# Patient Record
Sex: Female | Born: 1985 | Race: Black or African American | Hispanic: No | Smoking: Never smoker
Health system: Southern US, Community
[De-identification: ages and names within clinical notes are randomized; demographics above are authoritative.]

## PROBLEM LIST (undated history)

## (undated) DIAGNOSIS — N390 Urinary tract infection, site not specified: Secondary | ICD-10-CM

## (undated) DIAGNOSIS — E119 Type 2 diabetes mellitus without complications: Secondary | ICD-10-CM

## (undated) DIAGNOSIS — F329 Major depressive disorder, single episode, unspecified: Secondary | ICD-10-CM

## (undated) DIAGNOSIS — N879 Dysplasia of cervix uteri, unspecified: Secondary | ICD-10-CM

## (undated) DIAGNOSIS — F32A Depression, unspecified: Secondary | ICD-10-CM

## (undated) DIAGNOSIS — T8859XA Other complications of anesthesia, initial encounter: Secondary | ICD-10-CM

## (undated) DIAGNOSIS — G35 Multiple sclerosis: Secondary | ICD-10-CM

## (undated) DIAGNOSIS — G709 Myoneural disorder, unspecified: Secondary | ICD-10-CM

## (undated) DIAGNOSIS — E109 Type 1 diabetes mellitus without complications: Secondary | ICD-10-CM

## (undated) HISTORY — PX: CERVIX LESION DESTRUCTION: SHX591

## (undated) HISTORY — DX: Depression, unspecified: F32.A

## (undated) HISTORY — DX: Type 1 diabetes mellitus without complications: E10.9

## (undated) HISTORY — PX: EYE SURGERY: SHX253

## (undated) HISTORY — DX: Dysplasia of cervix uteri, unspecified: N87.9

## (undated) HISTORY — DX: Urinary tract infection, site not specified: N39.0

---
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
## 2011-06-06 HISTORY — PX: CERVIX LESION DESTRUCTION: SHX591

## 2016-11-24 DIAGNOSIS — G35 Multiple sclerosis: Secondary | ICD-10-CM | POA: Insufficient documentation

## 2016-11-24 DIAGNOSIS — E10319 Type 1 diabetes mellitus with unspecified diabetic retinopathy without macular edema: Secondary | ICD-10-CM | POA: Insufficient documentation

## 2016-11-24 DIAGNOSIS — F332 Major depressive disorder, recurrent severe without psychotic features: Secondary | ICD-10-CM | POA: Insufficient documentation

## 2016-11-24 DIAGNOSIS — D72829 Elevated white blood cell count, unspecified: Secondary | ICD-10-CM | POA: Insufficient documentation

## 2016-11-24 DIAGNOSIS — IMO0002 Reserved for concepts with insufficient information to code with codable children: Secondary | ICD-10-CM | POA: Insufficient documentation

## 2017-01-14 ENCOUNTER — Encounter (HOSPITAL_COMMUNITY): Payer: Self-pay | Admitting: *Deleted

## 2017-01-14 ENCOUNTER — Inpatient Hospital Stay (HOSPITAL_COMMUNITY)
Admission: EM | Admit: 2017-01-14 | Discharge: 2017-01-16 | DRG: 637 | Disposition: A | Discharge status: Home / Self Care | Payer: BLUE CROSS/BLUE SHIELD | Attending: Internal Medicine | Admitting: Internal Medicine

## 2017-01-14 DIAGNOSIS — T383X6A Underdosing of insulin and oral hypoglycemic [antidiabetic] drugs, initial encounter: Secondary | ICD-10-CM | POA: Diagnosis present

## 2017-01-14 DIAGNOSIS — N179 Acute kidney failure, unspecified: Secondary | ICD-10-CM | POA: Diagnosis present

## 2017-01-14 DIAGNOSIS — E875 Hyperkalemia: Secondary | ICD-10-CM | POA: Diagnosis present

## 2017-01-14 DIAGNOSIS — G35 Multiple sclerosis: Secondary | ICD-10-CM | POA: Diagnosis present

## 2017-01-14 DIAGNOSIS — E1010 Type 1 diabetes mellitus with ketoacidosis without coma: Secondary | ICD-10-CM | POA: Diagnosis not present

## 2017-01-14 DIAGNOSIS — Z91138 Patient's unintentional underdosing of medication regimen for other reason: Secondary | ICD-10-CM

## 2017-01-14 DIAGNOSIS — E876 Hypokalemia: Secondary | ICD-10-CM | POA: Diagnosis present

## 2017-01-14 DIAGNOSIS — Z882 Allergy status to sulfonamides status: Secondary | ICD-10-CM

## 2017-01-14 DIAGNOSIS — R6510 Systemic inflammatory response syndrome (SIRS) of non-infectious origin without acute organ dysfunction: Secondary | ICD-10-CM | POA: Diagnosis present

## 2017-01-14 DIAGNOSIS — E1110 Type 2 diabetes mellitus with ketoacidosis without coma: Secondary | ICD-10-CM | POA: Diagnosis present

## 2017-01-14 DIAGNOSIS — G9341 Metabolic encephalopathy: Secondary | ICD-10-CM | POA: Diagnosis present

## 2017-01-14 DIAGNOSIS — R000 Tachycardia, unspecified: Secondary | ICD-10-CM | POA: Diagnosis present

## 2017-01-14 DIAGNOSIS — Z9102 Food additives allergy status: Secondary | ICD-10-CM

## 2017-01-14 DIAGNOSIS — Z794 Long term (current) use of insulin: Secondary | ICD-10-CM

## 2017-01-14 HISTORY — DX: Type 2 diabetes mellitus without complications: E11.9

## 2017-01-14 HISTORY — DX: Multiple sclerosis: G35

## 2017-01-14 LAB — CBC
HCT: 45.8 % (ref 36.0–46.0)
Hemoglobin: 14.6 g/dL (ref 12.0–15.0)
MCH: 29.4 pg (ref 26.0–34.0)
MCHC: 31.9 g/dL (ref 30.0–36.0)
MCV: 92.3 fL (ref 78.0–100.0)
PLATELETS: 445 10*3/uL — AB (ref 150–400)
RBC: 4.96 MIL/uL (ref 3.87–5.11)
RDW: 13.1 % (ref 11.5–15.5)
WBC: 16.6 10*3/uL — AB (ref 4.0–10.5)

## 2017-01-14 LAB — I-STAT VENOUS BLOOD GAS, ED
ACID-BASE DEFICIT: 27 mmol/L — AB (ref 0.0–2.0)
Acid-base deficit: 24 mmol/L — ABNORMAL HIGH (ref 0.0–2.0)
Acid-base deficit: 21 mmol/L — ABNORMAL HIGH (ref 0.0–2.0)
BICARBONATE: 5.5 mmol/L — AB (ref 20.0–28.0)
Bicarbonate: 5.4 mmol/L — ABNORMAL LOW (ref 20.0–28.0)
Bicarbonate: 6.5 mmol/L — ABNORMAL LOW (ref 20.0–28.0)
O2 SAT: 75 %
O2 SAT: 55 %
O2 Saturation: 65 %
PCO2 VEN: 28.5 mmHg — AB (ref 44.0–60.0)
PCO2 VEN: 21.8 mmHg — AB (ref 44.0–60.0)
PCO2 VEN: 21.4 mmHg — AB (ref 44.0–60.0)
PH VEN: 6.897 — AB (ref 7.250–7.430)
PO2 VEN: 56 mmHg — AB (ref 32.0–45.0)
PO2 VEN: 58 mmHg — AB (ref 32.0–45.0)
PO2 VEN: 38 mmHg (ref 32.0–45.0)
TCO2: 6 mmol/L (ref 0–100)
TCO2: 6 mmol/L (ref 0–100)
TCO2: 7 mmol/L (ref 0–100)
pH, Ven: 7.006 — CL (ref 7.250–7.430)
pH, Ven: 7.094 — CL (ref 7.250–7.430)

## 2017-01-14 LAB — I-STAT CHEM 8, ED
BUN: 24 mg/dL — AB (ref 6–20)
BUN: 20 mg/dL (ref 6–20)
BUN: 22 mg/dL — AB (ref 6–20)
BUN: 19 mg/dL (ref 6–20)
CALCIUM ION: 1.15 mmol/L (ref 1.15–1.40)
CHLORIDE: 108 mmol/L (ref 101–111)
CHLORIDE: 112 mmol/L — AB (ref 101–111)
CREATININE: 1 mg/dL (ref 0.44–1.00)
Calcium, Ion: 1.17 mmol/L (ref 1.15–1.40)
Calcium, Ion: 1.01 mmol/L — ABNORMAL LOW (ref 1.15–1.40)
Calcium, Ion: 1.13 mmol/L — ABNORMAL LOW (ref 1.15–1.40)
Chloride: 113 mmol/L — ABNORMAL HIGH (ref 101–111)
Chloride: 113 mmol/L — ABNORMAL HIGH (ref 101–111)
Creatinine, Ser: 1 mg/dL (ref 0.44–1.00)
Creatinine, Ser: 0.8 mg/dL (ref 0.44–1.00)
Creatinine, Ser: 0.8 mg/dL (ref 0.44–1.00)
GLUCOSE: 318 mg/dL — AB (ref 65–99)
Glucose, Bld: 543 mg/dL (ref 65–99)
Glucose, Bld: 335 mg/dL — ABNORMAL HIGH (ref 65–99)
Glucose, Bld: 429 mg/dL — ABNORMAL HIGH (ref 65–99)
HCT: 44 % (ref 36.0–46.0)
HEMATOCRIT: 51 % — AB (ref 36.0–46.0)
HEMATOCRIT: 47 % — AB (ref 36.0–46.0)
HEMATOCRIT: 50 % — AB (ref 36.0–46.0)
HEMOGLOBIN: 15 g/dL (ref 12.0–15.0)
Hemoglobin: 17.3 g/dL — ABNORMAL HIGH (ref 12.0–15.0)
Hemoglobin: 16 g/dL — ABNORMAL HIGH (ref 12.0–15.0)
Hemoglobin: 17 g/dL — ABNORMAL HIGH (ref 12.0–15.0)
POTASSIUM: 5.4 mmol/L — AB (ref 3.5–5.1)
POTASSIUM: 4.2 mmol/L (ref 3.5–5.1)
POTASSIUM: 4.7 mmol/L (ref 3.5–5.1)
Potassium: 4.3 mmol/L (ref 3.5–5.1)
SODIUM: 140 mmol/L (ref 135–145)
SODIUM: 142 mmol/L (ref 135–145)
SODIUM: 141 mmol/L (ref 135–145)
Sodium: 134 mmol/L — ABNORMAL LOW (ref 135–145)
TCO2: 9 mmol/L (ref 0–100)
TCO2: 8 mmol/L (ref 0–100)
TCO2: 9 mmol/L (ref 0–100)
TCO2: 9 mmol/L (ref 0–100)

## 2017-01-14 LAB — COMPREHENSIVE METABOLIC PANEL
ALK PHOS: 94 U/L (ref 38–126)
ALT: 25 U/L (ref 14–54)
AST: 30 U/L (ref 15–41)
Albumin: 4.7 g/dL (ref 3.5–5.0)
BUN: 20 mg/dL (ref 6–20)
CALCIUM: 9.3 mg/dL (ref 8.9–10.3)
CO2: <7 mmol/L — ABNORMAL LOW (ref 22–32)
CREATININE: 1.66 mg/dL — AB (ref 0.44–1.00)
Chloride: 101 mmol/L (ref 101–111)
GFR, EST AFRICAN AMERICAN: 47 mL/min — AB (ref >60)
GFR, EST NON AFRICAN AMERICAN: 41 mL/min — AB (ref >60)
Glucose, Bld: 521 mg/dL (ref 65–99)
Potassium: 5.6 mmol/L — ABNORMAL HIGH (ref 3.5–5.1)
Sodium: 134 mmol/L — ABNORMAL LOW (ref 135–145)
Total Bilirubin: 1.4 mg/dL — ABNORMAL HIGH (ref 0.3–1.2)
Total Protein: 9.2 g/dL — ABNORMAL HIGH (ref 6.5–8.1)

## 2017-01-14 LAB — URINALYSIS, ROUTINE W REFLEX MICROSCOPIC
Bilirubin Urine: NEGATIVE
KETONES UR: 80 mg/dL — AB
LEUKOCYTES UA: NEGATIVE
Nitrite: NEGATIVE
PH: 5 (ref 5.0–8.0)
Protein, ur: 100 mg/dL — AB
Specific Gravity, Urine: 1.015 (ref 1.005–1.030)

## 2017-01-14 LAB — CBG MONITORING, ED
GLUCOSE-CAPILLARY: 343 mg/dL — AB (ref 65–99)
GLUCOSE-CAPILLARY: 295 mg/dL — AB (ref 65–99)
Glucose-Capillary: 472 mg/dL — ABNORMAL HIGH (ref 65–99)
Glucose-Capillary: 518 mg/dL (ref 65–99)

## 2017-01-14 LAB — I-STAT BETA HCG BLOOD, ED (MC, WL, AP ONLY): I-stat hCG, quantitative: <5 m[IU]/mL (ref <5)

## 2017-01-14 LAB — LIPASE, BLOOD: Lipase: 25 U/L (ref 11–51)

## 2017-01-14 MED ORDER — DEXTROSE-NACL 5-0.45 % IV SOLN: INTRAVENOUS | Status: DC

## 2017-01-14 MED ORDER — SODIUM CHLORIDE 0.9 % IV SOLN
INTRAVENOUS | Status: DC
  Administered 2017-01-14: 4.6 [IU]/h via INTRAVENOUS
  Filled 2017-01-14: qty 1

## 2017-01-14 MED ORDER — SODIUM BICARBONATE 8.4 % IV SOLN
INTRAVENOUS | Status: AC
  Administered 2017-01-14: 50 meq
  Filled 2017-01-14: qty 50

## 2017-01-14 MED ORDER — SODIUM CHLORIDE 0.9 % IV BOLUS (SEPSIS)
1000.0000 mL | Freq: Once | INTRAVENOUS | Status: AC
  Administered 2017-01-14: 1000 mL via INTRAVENOUS

## 2017-01-14 MED ORDER — INSULIN ASPART 100 UNIT/ML ~~LOC~~ SOLN
10.0000 [IU] | Freq: Once | SUBCUTANEOUS | Status: AC
  Administered 2017-01-14: 10 [IU] via INTRAVENOUS
  Filled 2017-01-14: qty 1

## 2017-01-14 MED ORDER — SODIUM BICARBONATE 8.4 % IV SOLN
INTRAVENOUS | Status: DC
  Administered 2017-01-14: 21:00:00 via INTRAVENOUS
  Filled 2017-01-14 (×2): qty 100

## 2017-01-14 NOTE — ED Triage Notes (Signed)
Pt recently started taking Novolin today because she was out of her regular diabetic medication. Reports CBG 300-400. C/o chest pain and abd pain with vomiting

## 2017-01-14 NOTE — ED Provider Notes (Signed)
[facility]-EMERGENCY DEPT Provider Note   CSN: [id number] Arrival date & time: 01/14/17  1916     History   Chief Complaint Chief Complaint  Patient presents with  . Abdominal Pain  . Chest Pain   HPI   Blood pressure (!) 120/91, pulse (!) 132, temperature (!) 97.3 F (36.3 C), resp. rate (!) 24, last menstrual period 12/25/2016, SpO2 100 %.  Jeanne Mosley is a 31 y.o. female with past medical history significant for type 1 diabetes, MS complaining of sugars reading as abnormally high onset this morning. She states that she normally takes NovoLog sliding scale 4 times a day, she states that she was on the road and she didn't have her medication, she pulled over to a pharmacy and got Novolin N but she has been feeling like the sugar has been high with multiple episodes of nausea, vomiting, abdominal pain and lower chest pain after emesis. Leading up to that she was in her normal state of health, no fevers or other prodrome.  Past Medical History:  Diagnosis Date  . Diabetes mellitus without complication (HCC)   . MS (multiple sclerosis) (HCC)     There are no active problems to display for this patient.   Past Surgical History:  Procedure Laterality Date  . EYE SURGERY      OB History    No data available       Home Medications    Prior to Admission medications   Medication Sig Start Date End Date Taking? Authorizing Provider  insulin aspart (NOVOLOG) 100 UNIT/ML injection Inject 6 Units into the skin See admin instructions. Sliding scale   Yes [provider]  insulin NPH Human (HUMULIN N,NOVOLIN N) 100 UNIT/ML injection Inject 36 Units into the skin See admin instructions. Took 36 units because she did not have her novolog   Yes [provider]    Family History No family history on file.  Social History Social History  Substance Use Topics  . Smoking status: Never Smoker  . Smokeless tobacco: Never Used  . Alcohol use Yes     Allergies    Sulfa antibiotics and Sweetness enhancer   Review of Systems Review of Systems  A complete review of systems was obtained and all systems are negative except as noted in the HPI and PMH.    Physical Exam Updated Vital Signs BP 130/89   Pulse (!) 137   Temp (!) 97.3 F (36.3 C)   Resp 20   LMP 12/25/2016   SpO2 100%   Physical Exam  Constitutional: She is oriented to person, place, and time. She appears well-developed and well-nourished. No distress.  HENT:  Head: Normocephalic and atraumatic.  Dry mucous membranes  Eyes: Pupils are equal, round, and reactive to light. Conjunctivae and EOM are normal.  Neck: Normal range of motion.  Cardiovascular: Regular rhythm and intact distal pulses.   Tachycardic  Pulmonary/Chest: Effort normal and breath sounds normal.  Kussmaul breathing  Abdominal: Soft. There is no tenderness.  Musculoskeletal: Normal range of motion.  Neurological: She is alert and oriented to person, place, and time.  Somnolent but responsive to voice, ANO 3.  Skin: She is not diaphoretic.  Psychiatric: She has a normal mood and affect.  Nursing note and vitals reviewed.    ED Treatments / Results  Labs (all labs ordered are listed, but only abnormal results are displayed) Labs Reviewed  COMPREHENSIVE METABOLIC PANEL - Abnormal; Notable for the following:  Result Value   Sodium 134 (*)    Potassium 5.6 (*)    CO2 <7 (*)    Glucose, Bld 521 (*)    Creatinine, Ser 1.66 (*)    Total Protein 9.2 (*)    Total Bilirubin 1.4 (*)    GFR calc non Af Amer 41 (*)    GFR calc Af Amer 47 (*)    All other components within normal limits  CBC - Abnormal; Notable for the following:    WBC 16.6 (*)    Platelets 445 (*)    All other components within normal limits  URINALYSIS, ROUTINE W REFLEX MICROSCOPIC - Abnormal; Notable for the following:    Color, Urine STRAW (*)    APPearance HAZY (*)    Glucose, UA >=500 (*)    Hgb urine dipstick MODERATE (*)      Ketones, ur 80 (*)    Protein, ur 100 (*)    Bacteria, UA RARE (*)    Squamous Epithelial / LPF 0-5 (*)    All other components within normal limits  CBG MONITORING, ED - Abnormal; Notable for the following:    Glucose-Capillary 472 (*)    All other components within normal limits  I-STAT VENOUS BLOOD GAS, ED - Abnormal; Notable for the following:    pH, Ven 6.897 (*)    pCO2, Ven 28.5 (*)    pO2, Ven 56.0 (*)    Bicarbonate 5.5 (*)    Acid-base deficit 27.0 (*)    All other components within normal limits  I-STAT CHEM 8, ED - Abnormal; Notable for the following:    Sodium 134 (*)    Potassium 5.4 (*)    BUN 24 (*)    Glucose, Bld 543 (*)    Hemoglobin 17.3 (*)    HCT 51.0 (*)    All other components within normal limits  CBG MONITORING, ED - Abnormal; Notable for the following:    Glucose-Capillary 518 (*)    All other components within normal limits  I-STAT CHEM 8, ED - Abnormal; Notable for the following:    Chloride 112 (*)    BUN 22 (*)    Glucose, Bld 429 (*)    Calcium, Ion 1.01 (*)    Hemoglobin 17.0 (*)    HCT 50.0 (*)    All other components within normal limits  I-STAT VENOUS BLOOD GAS, ED - Abnormal; Notable for the following:    pH, Ven 7.006 (*)    pCO2, Ven 21.8 (*)    pO2, Ven 58.0 (*)    Bicarbonate 5.4 (*)    Acid-base deficit 24.0 (*)    All other components within normal limits  CBG MONITORING, ED - Abnormal; Notable for the following:    Glucose-Capillary 343 (*)    All other components within normal limits  LIPASE, BLOOD  I-STAT BETA HCG BLOOD, ED ([facility], [facility], [facility] ONLY)  I-STAT CHEM 8, ED    EKG  EKG Interpretation  Date/Time:  Sunday January 14 2017 19:22:45 EDT Ventricular Rate:  130 PR Interval:  134 QRS Duration: 76 QT Interval:  312 QTC Calculation: 459 R Axis:   43 Text Interpretation:  Sinus tachycardia Right atrial enlargement Anterior infarct , age undetermined Abnormal ECG no peaked Ts Confirmed by Blackville, Courteney ([id number]) on  01/14/2017 9:30:05 PM       Radiology No results found.  Procedures Procedures (including critical care time)  CRITICAL CARE Performed by: Wynetta Emery   Total critical care time:35  minutes  Critical care time was exclusive of separately billable procedures and treating other patients.  Critical care was necessary to treat or prevent imminent or life-threatening deterioration.  Critical care was time spent personally by me on the following activities: development of treatment plan with patient and/or surrogate as well as nursing, discussions with consultants, evaluation of patient's response to treatment, examination of patient, obtaining history from patient or surrogate, ordering and performing treatments and interventions, ordering and review of laboratory studies, ordering and review of radiographic studies, pulse oximetry and re-evaluation of patient's condition.   Medications Ordered in ED Medications  dextrose 5 %-0.45 % sodium chloride infusion (not administered)  insulin regular (NOVOLIN R,HUMULIN R) 100 Units in sodium chloride 0.9 % 100 mL (1 Units/mL) infusion (2.8 Units/hr Intravenous Rate/Dose Change 01/14/17 2201)  sodium bicarbonate 100 mEq in lactated ringers 1,000 mL infusion ( Intravenous New Bag/Given 01/14/17 2129)  sodium chloride 0.9 % bolus 1,000 mL (0 mLs Intravenous Stopped 01/14/17 2116)  insulin aspart (novoLOG) injection 10 Units (10 Units Intravenous Given 01/14/17 2024)  sodium bicarbonate 1 mEq/mL injection (50 mEq  Given 01/14/17 2114)     Initial Impression / Assessment and Plan / ED Course  I have reviewed the triage vital signs and the nursing notes.  Pertinent labs & imaging results that were available during my care of the patient were reviewed by me and considered in my medical decision making (see chart for details).  Clinical Course as of Jan 15 2220  Wynelle Link Jan 14, 2017  2113 Bicarbonate drip ordered, we've called pharmacy and they are  mixing it, I've asked him to expedite it. Patient will be given 50 mEq of bicarbonate in the meantime. Insulin infusion flowing  [NP]    Clinical Course User Index [NP] Wynetta Emery, [place]    Vitals:   01/14/17 2045 01/14/17 2100 01/14/17 2130 01/14/17 2145  BP: 136/84 134/72 134/86 130/89  Pulse: (!) 132 (!) 133 (!) 136 (!) 137  Resp: 20 19 (!) 22 20  Temp:      SpO2: 100% 100% 100% 100%    Medications  dextrose 5 %-0.45 % sodium chloride infusion (not administered)  insulin regular (NOVOLIN R,HUMULIN R) 100 Units in sodium chloride 0.9 % 100 mL (1 Units/mL) infusion (2.8 Units/hr Intravenous Rate/Dose Change 01/14/17 2201)  sodium bicarbonate 100 mEq in lactated ringers 1,000 mL infusion ( Intravenous New Bag/Given 01/14/17 2129)  sodium chloride 0.9 % bolus 1,000 mL (0 mLs Intravenous Stopped 01/14/17 2116)  insulin aspart (novoLOG) injection 10 Units (10 Units Intravenous Given 01/14/17 2024)  sodium bicarbonate 1 mEq/mL injection (50 mEq  Given 01/14/17 2114)    Draya Dillin is 31 y.o. female presenting with Hyperglycemia, tachycardic, somnolent, crit small breathing, clinical DKA. Patient is given 2 L of fluid, 10 units of insulin IV. Stabilizer drip initiated. VBG with a pH of 6.89. Bicarbonate drip ordered a, this has come from pharmacy. I've called pharmacy to try to expedite this patient is given 50 mEq of bicarbonate via push well pharmacy is mixing drip. Repeats VBG improving with a pH of 7.06. Patient and family updated, mental status unchanged. Attending physician discussed with critical care who will evaluate the patient.       Final Clinical Impressions(s) / ED Diagnoses   Final diagnoses:  Type 1 diabetes mellitus with ketoacidosis without coma [facility])    New Prescriptions New Prescriptions   No medications on file     Kaylyn Lim 01/14/17 2221  Abelino Derrick, MD 01/16/17 1007

## 2017-01-15 ENCOUNTER — Inpatient Hospital Stay (HOSPITAL_COMMUNITY): Payer: BLUE CROSS/BLUE SHIELD

## 2017-01-15 DIAGNOSIS — R000 Tachycardia, unspecified: Secondary | ICD-10-CM | POA: Diagnosis present

## 2017-01-15 DIAGNOSIS — G9341 Metabolic encephalopathy: Secondary | ICD-10-CM | POA: Diagnosis present

## 2017-01-15 DIAGNOSIS — Z882 Allergy status to sulfonamides status: Secondary | ICD-10-CM | POA: Diagnosis not present

## 2017-01-15 DIAGNOSIS — Z9102 Food additives allergy status: Secondary | ICD-10-CM | POA: Diagnosis not present

## 2017-01-15 DIAGNOSIS — E876 Hypokalemia: Secondary | ICD-10-CM | POA: Diagnosis present

## 2017-01-15 DIAGNOSIS — E1110 Type 2 diabetes mellitus with ketoacidosis without coma: Secondary | ICD-10-CM | POA: Diagnosis present

## 2017-01-15 DIAGNOSIS — T383X6A Underdosing of insulin and oral hypoglycemic [antidiabetic] drugs, initial encounter: Secondary | ICD-10-CM | POA: Diagnosis present

## 2017-01-15 DIAGNOSIS — G35 Multiple sclerosis: Secondary | ICD-10-CM | POA: Diagnosis present

## 2017-01-15 DIAGNOSIS — E1010 Type 1 diabetes mellitus with ketoacidosis without coma: Secondary | ICD-10-CM | POA: Diagnosis present

## 2017-01-15 DIAGNOSIS — R6510 Systemic inflammatory response syndrome (SIRS) of non-infectious origin without acute organ dysfunction: Secondary | ICD-10-CM | POA: Diagnosis present

## 2017-01-15 DIAGNOSIS — N179 Acute kidney failure, unspecified: Secondary | ICD-10-CM | POA: Diagnosis present

## 2017-01-15 DIAGNOSIS — Z91138 Patient's unintentional underdosing of medication regimen for other reason: Secondary | ICD-10-CM | POA: Diagnosis not present

## 2017-01-15 DIAGNOSIS — Z794 Long term (current) use of insulin: Secondary | ICD-10-CM | POA: Diagnosis not present

## 2017-01-15 DIAGNOSIS — E0810 Diabetes mellitus due to underlying condition with ketoacidosis without coma: Secondary | ICD-10-CM | POA: Diagnosis not present

## 2017-01-15 DIAGNOSIS — E875 Hyperkalemia: Secondary | ICD-10-CM | POA: Diagnosis present

## 2017-01-15 LAB — I-STAT CHEM 8, ED
BUN: 25 mg/dL — ABNORMAL HIGH (ref 6–20)
CREATININE: 0.8 mg/dL (ref 0.44–1.00)
Calcium, Ion: 1.17 mmol/L (ref 1.15–1.40)
Chloride: 112 mmol/L — ABNORMAL HIGH (ref 101–111)
GLUCOSE: 253 mg/dL — AB (ref 65–99)
HCT: 42 % (ref 36.0–46.0)
HEMOGLOBIN: 14.3 g/dL (ref 12.0–15.0)
POTASSIUM: 5.5 mmol/L — AB (ref 3.5–5.1)
Sodium: 141 mmol/L (ref 135–145)
TCO2: 12 mmol/L (ref 0–100)

## 2017-01-15 LAB — POCT I-STAT 3, ART BLOOD GAS (G3+)
ACID-BASE DEFICIT: 11 mmol/L — AB (ref 0.0–2.0)
BICARBONATE: 14.2 mmol/L — AB (ref 20.0–28.0)
O2 Saturation: 97 %
PH ART: 7.297 — AB (ref 7.350–7.450)
TCO2: 15 mmol/L (ref 0–100)
pCO2 arterial: 29.1 mmHg — ABNORMAL LOW (ref 32.0–48.0)
pO2, Arterial: 96 mmHg (ref 83.0–108.0)

## 2017-01-15 LAB — I-STAT ARTERIAL BLOOD GAS, ED
Acid-base deficit: 16 mmol/L — ABNORMAL HIGH (ref 0.0–2.0)
BICARBONATE: 9.9 mmol/L — AB (ref 20.0–28.0)
O2 SAT: 96 %
PCO2 ART: 24.4 mmHg — AB (ref 32.0–48.0)
Patient temperature: 98.6
TCO2: 11 mmol/L (ref 0–100)
pH, Arterial: 7.214 — ABNORMAL LOW (ref 7.350–7.450)
pO2, Arterial: 93 mmHg (ref 83.0–108.0)

## 2017-01-15 LAB — COMPREHENSIVE METABOLIC PANEL
ALBUMIN: 3.8 g/dL (ref 3.5–5.0)
ALK PHOS: 73 U/L (ref 38–126)
ALT: 23 U/L (ref 14–54)
AST: 39 U/L (ref 15–41)
Anion gap: 19 — ABNORMAL HIGH (ref 5–15)
BILIRUBIN TOTAL: 2.3 mg/dL — AB (ref 0.3–1.2)
BUN: 17 mg/dL (ref 6–20)
CALCIUM: 8.4 mg/dL — AB (ref 8.9–10.3)
CO2: 11 mmol/L — AB (ref 22–32)
Chloride: 109 mmol/L (ref 101–111)
Creatinine, Ser: 1.46 mg/dL — ABNORMAL HIGH (ref 0.44–1.00)
GFR calc Af Amer: 55 mL/min — ABNORMAL LOW (ref >60)
GFR calc non Af Amer: 48 mL/min — ABNORMAL LOW (ref >60)
GLUCOSE: 241 mg/dL — AB (ref 65–99)
Potassium: 5.5 mmol/L — ABNORMAL HIGH (ref 3.5–5.1)
SODIUM: 139 mmol/L (ref 135–145)
TOTAL PROTEIN: 7.1 g/dL (ref 6.5–8.1)

## 2017-01-15 LAB — CBC WITH DIFFERENTIAL/PLATELET
Basophils Absolute: 0 10*3/uL (ref 0.0–0.1)
Basophils Relative: 0 %
Eosinophils Absolute: 0 10*3/uL (ref 0.0–0.7)
Eosinophils Relative: 0 %
HEMATOCRIT: 35.7 % — AB (ref 36.0–46.0)
HEMOGLOBIN: 11.6 g/dL — AB (ref 12.0–15.0)
LYMPHS ABS: 2.7 10*3/uL (ref 0.7–4.0)
LYMPHS PCT: 22 %
MCH: 28.6 pg (ref 26.0–34.0)
MCHC: 32.5 g/dL (ref 30.0–36.0)
MCV: 87.9 fL (ref 78.0–100.0)
MONO ABS: 1.4 10*3/uL — AB (ref 0.1–1.0)
Monocytes Relative: 11 %
NEUTROS ABS: 8 10*3/uL — AB (ref 1.7–7.7)
Neutrophils Relative %: 67 %
Platelets: 304 10*3/uL (ref 150–400)
RBC: 4.06 MIL/uL (ref 3.87–5.11)
RDW: 12.9 % (ref 11.5–15.5)
WBC: 12.2 10*3/uL — ABNORMAL HIGH (ref 4.0–10.5)

## 2017-01-15 LAB — GLUCOSE, CAPILLARY
GLUCOSE-CAPILLARY: 166 mg/dL — AB (ref 65–99)
GLUCOSE-CAPILLARY: 163 mg/dL — AB (ref 65–99)
GLUCOSE-CAPILLARY: 159 mg/dL — AB (ref 65–99)
GLUCOSE-CAPILLARY: 139 mg/dL — AB (ref 65–99)
GLUCOSE-CAPILLARY: 124 mg/dL — AB (ref 65–99)
GLUCOSE-CAPILLARY: 110 mg/dL — AB (ref 65–99)
GLUCOSE-CAPILLARY: 268 mg/dL — AB (ref 65–99)
GLUCOSE-CAPILLARY: 141 mg/dL — AB (ref 65–99)
Glucose-Capillary: 152 mg/dL — ABNORMAL HIGH (ref 65–99)
Glucose-Capillary: 160 mg/dL — ABNORMAL HIGH (ref 65–99)
Glucose-Capillary: 95 mg/dL (ref 65–99)
Glucose-Capillary: 108 mg/dL — ABNORMAL HIGH (ref 65–99)
Glucose-Capillary: 150 mg/dL — ABNORMAL HIGH (ref 65–99)
Glucose-Capillary: 222 mg/dL — ABNORMAL HIGH (ref 65–99)
Glucose-Capillary: 212 mg/dL — ABNORMAL HIGH (ref 65–99)
Glucose-Capillary: 150 mg/dL — ABNORMAL HIGH (ref 65–99)
Glucose-Capillary: 93 mg/dL (ref 65–99)
Glucose-Capillary: 160 mg/dL — ABNORMAL HIGH (ref 65–99)

## 2017-01-15 LAB — BASIC METABOLIC PANEL
Anion gap: 9 (ref 5–15)
Anion gap: 11 (ref 5–15)
Anion gap: 11 (ref 5–15)
BUN: 8 mg/dL (ref 6–20)
BUN: 7 mg/dL (ref 6–20)
BUN: 5 mg/dL — AB (ref 6–20)
CALCIUM: 7.2 mg/dL — AB (ref 8.9–10.3)
CALCIUM: 7.4 mg/dL — AB (ref 8.9–10.3)
CHLORIDE: 101 mmol/L (ref 101–111)
CHLORIDE: 104 mmol/L (ref 101–111)
CO2: 25 mmol/L (ref 22–32)
CO2: 25 mmol/L (ref 22–32)
CO2: 25 mmol/L (ref 22–32)
CREATININE: 0.74 mg/dL (ref 0.44–1.00)
CREATININE: 0.73 mg/dL (ref 0.44–1.00)
CREATININE: 0.65 mg/dL (ref 0.44–1.00)
Calcium: 7.3 mg/dL — ABNORMAL LOW (ref 8.9–10.3)
Chloride: 109 mmol/L (ref 101–111)
GFR calc Af Amer: >60 mL/min (ref >60)
GFR calc non Af Amer: >60 mL/min (ref >60)
GFR calc non Af Amer: >60 mL/min (ref >60)
GFR calc non Af Amer: >60 mL/min (ref >60)
GLUCOSE: 188 mg/dL — AB (ref 65–99)
Glucose, Bld: 110 mg/dL — ABNORMAL HIGH (ref 65–99)
Glucose, Bld: 147 mg/dL — ABNORMAL HIGH (ref 65–99)
Potassium: 2.9 mmol/L — ABNORMAL LOW (ref 3.5–5.1)
Potassium: 2.9 mmol/L — ABNORMAL LOW (ref 3.5–5.1)
Potassium: 3.1 mmol/L — ABNORMAL LOW (ref 3.5–5.1)
Sodium: 143 mmol/L (ref 135–145)
Sodium: 137 mmol/L (ref 135–145)
Sodium: 140 mmol/L (ref 135–145)

## 2017-01-15 LAB — CBG MONITORING, ED
GLUCOSE-CAPILLARY: 220 mg/dL — AB (ref 65–99)
GLUCOSE-CAPILLARY: 265 mg/dL — AB (ref 65–99)
Glucose-Capillary: 165 mg/dL — ABNORMAL HIGH (ref 65–99)

## 2017-01-15 LAB — I-STAT VENOUS BLOOD GAS, ED
ACID-BASE DEFICIT: 16 mmol/L — AB (ref 0.0–2.0)
Bicarbonate: 10 mmol/L — ABNORMAL LOW (ref 20.0–28.0)
O2 SAT: 62 %
PH VEN: 7.2 — AB (ref 7.250–7.430)
TCO2: 11 mmol/L (ref 0–100)
pCO2, Ven: 25.7 mmHg — ABNORMAL LOW (ref 44.0–60.0)
pO2, Ven: 38 mmHg (ref 32.0–45.0)

## 2017-01-15 LAB — HIV ANTIBODY (ROUTINE TESTING W REFLEX): HIV Screen 4th Generation wRfx: NONREACTIVE

## 2017-01-15 LAB — LACTIC ACID, PLASMA
Lactic Acid, Venous: 2.3 mmol/L (ref 0.5–1.9)
Lactic Acid, Venous: 1.6 mmol/L (ref 0.5–1.9)

## 2017-01-15 LAB — PHOSPHORUS: Phosphorus: 1.8 mg/dL — ABNORMAL LOW (ref 2.5–4.6)

## 2017-01-15 LAB — MAGNESIUM: Magnesium: 2.1 mg/dL (ref 1.7–2.4)

## 2017-01-15 LAB — MRSA PCR SCREENING: MRSA by PCR: NEGATIVE

## 2017-01-15 MED ORDER — SODIUM CHLORIDE 0.9 % IV SOLN
INTRAVENOUS | Status: DC
  Administered 2017-01-16: 01:00:00 via INTRAVENOUS

## 2017-01-15 MED ORDER — ASPIRIN 81 MG PO CHEW
324.0000 mg | CHEWABLE_TABLET | ORAL | Status: AC
  Administered 2017-01-15: 324 mg via ORAL
  Filled 2017-01-15: qty 4

## 2017-01-15 MED ORDER — DEXTROSE-NACL 5-0.45 % IV SOLN
INTRAVENOUS | Status: DC
  Administered 2017-01-15 (×2): via INTRAVENOUS

## 2017-01-15 MED ORDER — SODIUM CHLORIDE 0.9 % IV SOLN
INTRAVENOUS | Status: DC
  Administered 2017-01-15: 01:00:00 via INTRAVENOUS

## 2017-01-15 MED ORDER — SODIUM CHLORIDE 0.9 % IV SOLN
INTRAVENOUS | Status: DC
  Administered 2017-01-15: 6.2 [IU]/h via INTRAVENOUS
  Filled 2017-01-15 (×2): qty 1

## 2017-01-15 MED ORDER — INSULIN GLARGINE 100 UNIT/ML ~~LOC~~ SOLN
15.0000 [IU] | Freq: Every day | SUBCUTANEOUS | Status: DC
  Administered 2017-01-16: 15 [IU] via SUBCUTANEOUS
  Filled 2017-01-15: qty 0.15

## 2017-01-15 MED ORDER — ASPIRIN 300 MG RE SUPP: 300.0000 mg | RECTAL | Status: AC

## 2017-01-15 MED ORDER — FAMOTIDINE IN NACL 20-0.9 MG/50ML-% IV SOLN
20.0000 mg | Freq: Two times a day (BID) | INTRAVENOUS | Status: DC
  Administered 2017-01-15 – 2017-01-16 (×3): 20 mg via INTRAVENOUS
  Filled 2017-01-15 (×3): qty 50

## 2017-01-15 MED ORDER — SODIUM CHLORIDE 0.9 % IV SOLN: 250.0000 mL | INTRAVENOUS | Status: DC | PRN

## 2017-01-15 MED ORDER — HEPARIN SODIUM (PORCINE) 5000 UNIT/ML IJ SOLN: 5000.0000 [IU] | Freq: Three times a day (TID) | INTRAMUSCULAR | Status: DC

## 2017-01-15 MED ORDER — SODIUM CHLORIDE 0.9 % IV BOLUS (SEPSIS)
2000.0000 mL | Freq: Once | INTRAVENOUS | Status: AC
  Administered 2017-01-15: 2000 mL via INTRAVENOUS

## 2017-01-15 MED ORDER — POTASSIUM CHLORIDE CRYS ER 20 MEQ PO TBCR
40.0000 meq | EXTENDED_RELEASE_TABLET | Freq: Once | ORAL | Status: AC
  Administered 2017-01-15: 40 meq via ORAL
  Filled 2017-01-15: qty 2

## 2017-01-15 MED ORDER — HEPARIN SODIUM (PORCINE) 5000 UNIT/ML IJ SOLN
5000.0000 [IU] | Freq: Three times a day (TID) | INTRAMUSCULAR | Status: DC
  Administered 2017-01-15 – 2017-01-16 (×4): 5000 [IU] via SUBCUTANEOUS
  Filled 2017-01-15 (×4): qty 1

## 2017-01-15 MED ORDER — STERILE WATER FOR INJECTION IV SOLN
INTRAVENOUS | Status: DC
  Administered 2017-01-15 (×2): via INTRAVENOUS
  Filled 2017-01-15 (×6): qty 850

## 2017-01-15 MED ORDER — INSULIN ASPART 100 UNIT/ML ~~LOC~~ SOLN
0.0000 [IU] | SUBCUTANEOUS | Status: DC
  Administered 2017-01-16 (×2): 3 [IU] via SUBCUTANEOUS

## 2017-01-15 MED ORDER — ONDANSETRON HCL 4 MG/2ML IJ SOLN: 4.0000 mg | Freq: Four times a day (QID) | INTRAMUSCULAR | Status: DC | PRN

## 2017-01-15 MED ORDER — ALBUTEROL SULFATE (2.5 MG/3ML) 0.083% IN NEBU: 2.5000 mg | INHALATION_SOLUTION | RESPIRATORY_TRACT | Status: DC | PRN

## 2017-01-15 MED ORDER — ACETAMINOPHEN 325 MG PO TABS
650.0000 mg | ORAL_TABLET | ORAL | Status: DC | PRN
  Administered 2017-01-16: 650 mg via ORAL
  Filled 2017-01-15: qty 2

## 2017-01-15 NOTE — Care Management Note (Addendum)
Case Management Note  Patient Details  Name: Jeanne Mosley MRN: [id number] Date of Birth: [birth year]  Subjective/Objective:   From home presents with DKA ,pta indep has hx of MS. No insurance or PCP listed.  Patient gave insurance information to NCM and NCM gave to Admission to enter into system.                 Action/Plan: NCM will follow for dc needs.  Expected Discharge Date:                  Expected Discharge Plan:     In-House Referral:     Discharge planning Services  CM Consult  Post Acute Care Choice:    Choice offered to:     DME Arranged:    DME Agency:     HH Arranged:    HH Agency:     Status of Service:  In process, will continue to follow  If discussed at Long Length of Stay Meetings, dates discussed:    Additional Comments:  Leone Haven, RN 01/15/2017, 10:32 AM

## 2017-01-15 NOTE — H&P (Signed)
PULMONARY / CRITICAL CARE MEDICINE   Name: Jeanne Mosley MRN: [id number] DOB: October 01, 1985    ADMISSION DATE:  01/14/2017 CONSULTATION DATE: 01/14/17  REFERRING MD:  ER  CHIEF COMPLAINT:  DKA  HISTORY OF PRESENT ILLNESS:   31yoF with IDDM and Multiple sclerosis, presents to the ER tonight c/o CP, Abd pain, N/V, and admits to nonproductive cough. She was on a road trip and ran out of her Novalin NPH insulin. She picked up some Novalog (asparte) insulin instead but says despite taking it her blood glucose kept increasing. On presentation in the ER, she was somnolent and had pH 6.89, Bicarb 5.4, and blood glucose > 500. She was given 1L NS and started on an insulin gtt and a bicarb gtt. ICU consult was called. At the time of my exam, her labs are already improving, although she is very sleepy. She denies SOB or CP to me. She admits to nonproductive cough. She admits to some abdominal pain. Denies dysuria.     PAST MEDICAL HISTORY :  She  has a past medical history of Diabetes mellitus without complication (HCC) and MS (multiple sclerosis) (HCC).  PAST SURGICAL HISTORY: She  has a past surgical history that includes Eye surgery.  Allergies  Allergen Reactions  . Sulfa Antibiotics Hives    hives  . Sweetness Enhancer Rash    No current facility-administered medications on file prior to encounter.    No current outpatient prescriptions on file prior to encounter.   FAMILY HISTORY:  Her has no family status information on file.   SOCIAL HISTORY: She  reports that she has never smoked. She has never used smokeless tobacco. She reports that she drinks alcohol. She reports that she does not use drugs.  REVIEW OF SYSTEMS:   Review of Systems  Constitutional: Negative.   HENT: Negative.   Eyes: Negative.   Respiratory: Positive for cough. Negative for sputum production, shortness of breath and wheezing.   Cardiovascular: Positive for chest pain.  Gastrointestinal: Positive for  abdominal pain, nausea and vomiting.  Genitourinary: Negative.  Negative for dysuria.  Musculoskeletal: Negative.   Skin: Negative.   Neurological: Negative.   Endo/Heme/Allergies: Negative.   Psychiatric/Behavioral: Negative.    VITAL SIGNS: BP 134/81   Pulse (!) 130   Temp (!) 97.3 F (36.3 C)   Resp 19   LMP 12/25/2016   SpO2 100%   INTAKE / OUTPUT: No intake/output data recorded.  PHYSICAL EXAMINATION: General: WDWN young female, lying on ER stretcher, very sleepy, in NAD Neuro: very somnolent, arousable to loud voice but falls back to sleep while I'm talking to her. Attempting to briefly answer questions. Obeying commands. Moving all extremities. PERRL.  HEENT: OP clear, MM moist, PERRL Cardiovascular: Tachycardic to 130's with a regular rhythm Lungs: CTA b/l Abdomen: Soft flat NTND, BS+ Musculoskeletal: no LE edema Skin: no rashes   LABS:  BMET  Recent Labs Lab 01/14/17 1936  01/14/17 2134 01/14/17 2223 01/14/17 2305  NA 134*  < > 140 142 141  K 5.6*  < > 4.7 4.2 4.3  CL 101  < > 112* 113* 113*  CO2 <7*  --   --   --   --   BUN 20  < > 22* 20 19  CREATININE 1.66*  < > 0.80 1.00 0.80  GLUCOSE 521*  < > 429* 335* 318*  < > = values in this interval not displayed.  Electrolytes  Recent Labs Lab 01/14/17 1936  CALCIUM 9.3  CBC  Recent Labs Lab 01/14/17 1936  01/14/17 2134 01/14/17 2223 01/14/17 2305  WBC 16.6*  --   --   --   --   HGB 14.6  < > 17.0* 16.0* 15.0  HCT 45.8  < > 50.0* 47.0* 44.0  PLT 445*  --   --   --   --   < > = values in this interval not displayed.  Coag's No results for input(s): APTT, INR in the last 168 hours.  Sepsis Markers No results for input(s): LATICACIDVEN, PROCALCITON, O2SATVEN in the last 168 hours.  ABG No results for input(s): PHART, PCO2ART, PO2ART in the last 168 hours.  Liver Enzymes  Recent Labs Lab 01/14/17 1936  AST 30  ALT 25  ALKPHOS 94  BILITOT 1.4*  ALBUMIN 4.7   Cardiac Enzymes No  results for input(s): TROPONINI, PROBNP in the last 168 hours.  Glucose  Recent Labs Lab 01/14/17 1921 01/14/17 2050 01/14/17 2159 01/14/17 2259 01/15/17 0021  GLUCAP 472* 518* 343* 295* 265*   Imaging No results found.  LINES/TUBES: 20G and 22G PIVs  ASSESSMENT / PLAN: 31yoF with IDDM and Multiple sclerosis, presents to the ER tonight with DKA after running out of her insulin.   PULMONARY No active issues   CARDIOVASCULAR 1. Sinus tachycardia: - from intravascular volume depletion; continue IVF's as described below  2. CP: patient c/o CP on admission, but at time of my exam did not have any active CP. No ST changes on EKG   RENAL 1. AKI; Hyperkalemia (resolved):  - creatinine 1.66 on admission, already improved to 0.80 with IVF's - potassium initially 5.6 improved to 4.3 with IVF's.  - monitor UOP closely; continue IVF's  GASTROINTESTINAL 1. Abdominal pain: patient c/o abd pain on admission but abdomen is nontender on my exam; LFT's normal. UA negative. Continue to monitor. If still c/o abd pain in the AM will pursue further workup.  HEMATOLOGIC No active issues   INFECTIOUS 1. SIRS: meets SIRS criteria but likely due to the DKA alone. UA negative. CXR negative. Will check procalcitonin and trend lactate. But no need for empiric antibiotics at this time.   ENDOCRINE 1. Severe DKA; Metabolic acidosis: - precipitated by running out of her insulin; do not think that this was precipitated by infection. She did c/o nonproductive cough but CXR on my review shows no infiltrates. UA shows in infection.  - only received 1L NS thus far; will give another 2L NS now - continue insulin gtt - change Bicarb gtt (from LR with bicarb to Free water with bicarb) - ABG still very acidotic but improving  NEUROLOGIC 1. Acute encephalopathy: - very somnolent likely due to the DKA itself and due to the hour of day presently; did not receive any sedating medications in  the ER. Exam is afocal. No hypecapnea on ABG. Continue to monitor.   45 minutes critical care time  Milana Obey, MD  Pulmonary and Critical Care Medicine [facility] Pager: [phone]  01/15/2017, 12:49 AM

## 2017-01-15 NOTE — Progress Notes (Signed)
PULMONARY / CRITICAL CARE MEDICINE   Name: Jeanne Mosley MRN: [id number] DOB: 10-Sep-1985    ADMISSION DATE:  01/14/2017 CONSULTATION DATE:  01/15/17  REFERRING MD:  Dr. Lynetta Mare / EDP   CHIEF COMPLAINT:  DKA   BRIEF SUMMARY: 31 y/o F with PMH of MS, IDDM admitted 8/12 with complaints of chest pain, abdominal pain, nausea / vomiting and productive cough. She reported she had run out of her NPH insulin.  She picked up some Novalog insulin but despite taking it her blood sugars increased.  On admit, she was found to be somnolent, pH 6.89, Bicarb 5.4 and glucose > 500.  She was treated with IVF and insulin gtt.  PCCM called for admission.     SUBJECTIVE:  Pt reports feeling better than on admit.  AM labs pending from 0400.  Remains on 0.5 units / hr.   VITAL SIGNS: BP 116/81   Pulse (!) 112   Temp 98.5 F (36.9 C) (Oral)   Resp 16   Ht 5\' 2"  (1.575 m)   Wt 140 lb 3.4 oz (63.6 kg)   LMP 12/25/2016   SpO2 100%   BMI 25.65 kg/m   HEMODYNAMICS:    VENTILATOR SETTINGS:    INTAKE / OUTPUT: I/O last 3 completed shifts: In: 6322.9 [I.V.:3322.9; IV Piggyback:3000] Out: 625 [Urine:625]  PHYSICAL EXAMINATION: General: young adult female in NAD, lying in bed  HEENT: MM pink/moist, no jvd PSY: calm /  appropriate Neuro: AAOx4, speech clear, MAE CV: s1s2 rrr, no m/r/g PULM: even/non-labored, lungs bilaterally clear  WU:JWJX, non-tender, bsx4 active  Extremities: warm/dry, no edema  Skin: no rashes or lesions   LABS:  BMET  Recent Labs Lab 01/14/17 1936  01/14/17 2305 01/15/17 0055 01/15/17 0231  NA 134*  < > 141 141 139  K 5.6*  < > 4.3 5.5* 5.5*  CL 101  < > 113* 112* 109  CO2 <7*  --   --   --  11*  BUN 20  < > 19 25* 17  CREATININE 1.66*  < > 0.80 0.80 1.46*  GLUCOSE 521*  < > 318* 253* 241*  < > = values in this interval not displayed.  Electrolytes  Recent Labs Lab 01/14/17 1936 01/15/17 0231  CALCIUM 9.3 8.4*  MG  --  2.1  PHOS  --  1.8*     CBC  Recent Labs Lab 01/14/17 1936  01/14/17 2305 01/15/17 0055 01/15/17 0344  WBC 16.6*  --   --   --  12.2*  HGB 14.6  < > 15.0 14.3 11.6*  HCT 45.8  < > 44.0 42.0 35.7*  PLT 445*  --   --   --  304  < > = values in this interval not displayed.  Coag's No results for input(s): APTT, INR in the last 168 hours.  Sepsis Markers  Recent Labs Lab 01/15/17 0229 01/15/17 0344  LATICACIDVEN 2.3* 1.6    ABG  Recent Labs Lab 01/15/17 0229 01/15/17 0417  PHART 7.214* 7.297*  PCO2ART 24.4* 29.1*  PO2ART 93.0 96.0    Liver Enzymes  Recent Labs Lab 01/14/17 1936 01/15/17 0231  AST 30 39  ALT 25 23  ALKPHOS 94 73  BILITOT 1.4* 2.3*  ALBUMIN 4.7 3.8    Cardiac Enzymes No results for input(s): TROPONINI, PROBNP in the last 168 hours.  Glucose  Recent Labs Lab 01/15/17 0419 01/15/17 0525 01/15/17 0611 01/15/17 0721 01/15/17 0804 01/15/17 0922  GLUCAP 163* 159* 152* 160* 139*  124*    Imaging Dg Chest Port 1 View  Result Date: 01/15/2017 CLINICAL DATA:  31 y/o  F; DKA 8 tonight. EXAM: PORTABLE CHEST 1 VIEW COMPARISON:  None. FINDINGS: Low lung volumes accentuate pulmonary markings. Cardiac silhouette within normal limits given projection and technique. Clear lungs. No pleural effusion or pneumothorax. Bones are unremarkable. IMPRESSION: Low lung volumes.  No acute pulmonary process identified. Electronically Signed   By: Mitzi Hansen M.D.   On: 01/15/2017 01:03     STUDIES:    CULTURES:   ANTIBIOTICS:   SIGNIFICANT EVENTS: 8/12  Admit with DKA  LINES/TUBES:   DISCUSSION: 31 y/o F admitted with DKA after running out of insulin.    ASSESSMENT / PLAN:  ENDOCRINE A:   DKA  DM II    P:   Q4 BMP > awaiting am labs from 0400 DKA protocol  Insulin gtt  Bicarbonate gtt at 113ml/hr, assess for d/c once labs returned  D5 1/2 NS at 125 ml/hr Will move to floor if AG closed and transition to SSI  Allow water  Consult to DM  coordinator   PULMONARY A: No acute issues  P:   Monitor   CARDIOVASCULAR A:  Sinus Tachycardia  P:  Monitor telemetry   RENAL A:   AG Metabolic Acidosis / Severe DKA  Hypophosphatemia  P:   Trend BMP / urinary output Replace electrolytes as indicated Avoid nephrotoxic agents, ensure adequate renal perfusion Treat elevated glucose  GASTROINTESTINAL A:   Abdominal Pain - normal LFT's, UA negative Nausea  P:   Monitor  Advance to clear liquid diabetic diet as tolerated  PRN zofran for nausea Pepcid  HEMATOLOGIC A:   No acute issues  P:  Trend CBC   INFECTIOUS A:   SIRS Response - in setting of DKA UA negative, CXR negative.  P:   No indication of acute infection, monitor off abx    NEUROLOGIC A:   Acute Metabolic Encephalopathy - in setting of DKA, resolved  P:   RASS goal: n/a Minimize sedating medications    FAMILY  - Updates: Patient and family updated at bedside on NP rounding 8/13.     CC Time: 30 minutes   Canary Brim, NP-C [facility] Pgr: [phone] or if no answer [phone] 01/15/2017, 10:33 AM

## 2017-01-15 NOTE — Progress Notes (Signed)
RN called lab to inquire about late BMET. Lab ensured RN that someone would be coming to draw STAT. MD and NP updated on the delay. Will continue to monitor pt closely.

## 2017-01-15 NOTE — Progress Notes (Signed)
eLink Physician-Brief Progress Note Patient Name: Jeanne Mosley DOB: May 19, 1986 MRN: [id number]   Date of Service  01/15/2017  HPI/Events of Note  AG resolved  eICU Interventions  Dc dextrose gtt, start diab diet Give lantus 15 U & turn off insulin gtt 2h later SSI -moderate Can transfer out f ICU if remain soff insulin gtt in 6h      Intervention Category Major Interventions: Hyperglycemia - active titration of insulin therapy  ALVA,RAKESH V. 01/15/2017, 11:39 PM

## 2017-01-15 NOTE — Progress Notes (Signed)
eLink Physician-Brief Progress Note Patient Name: Jeanne Mosley DOB: 11-12-1985 MRN: [id number]   Date of Service  01/15/2017  HPI/Events of Note  Remains on Insulin IV infusion and D5 0.45 NaCl at 125 mL/hour. Now eating a diet. Last blood glucose = 260. Remains on NaHCO3 IV infusion. Last pH from 4:17 AM = 7.29.  eICU Interventions  Will order: 1. D/C NaHCO3 IV infusion. 2. Decrease D5 0.45 NaCl IV infusion to 30 mL/hour.  Hopefully, can transition to Lantus + Novolog SSI [place] in next several hours.      Intervention Category Major Interventions: Acid-Base disturbance - evaluation and management;Hyperglycemia - active titration of insulin therapy  Lenell Antu 01/15/2017, 4:10 PM

## 2017-01-15 NOTE — Progress Notes (Addendum)
Inpatient Diabetes Program Recommendations  AACE/ADA: New Consensus Statement on Inpatient Glycemic Control (2015)  Target Ranges:  Prepandial:   less than 140 mg/dL      Peak postprandial:   less than 180 mg/dL (1-2 hours)      Critically ill patients:  140 - 180 mg/dL  Results for Jeanne Mosley, Jeanne Mosley (MRN [id number]) as of 01/15/2017 08:39  Ref. Range 01/15/2017 04:19 01/15/2017 05:25 01/15/2017 06:11 01/15/2017 07:21 01/15/2017 08:04  Glucose-Capillary Latest Ref Range: 65 - 99 mg/dL 384 (H) 536 (H) 468 (H) 160 (H) 139 (H)   Results for Jeanne Mosley, Jeanne Mosley (MRN [id number]) as of 01/15/2017 08:39  Ref. Range 01/14/2017 21:34 01/14/2017 22:23 01/14/2017 23:05 01/15/2017 00:55 01/15/2017 02:31  Glucose Latest Ref Range: 65 - 99 mg/dL 500 (H) 370 (H) 488 (H) 253 (H) 241 (H)   Review of Glycemic Control  Diabetes history: DM1 (makes no insulin; will need basal, correction, and meal coverage) Outpatient Diabetes medications: Lantus 22 units QHS, Novolog 6 units with meals plus correction Current orders for Inpatient glycemic control: IV insulin per DKA order set  Inpatient Diabetes Program Recommendations:  Insulin - IV drip/GlucoStabilizer: Patient remains on IV insulin. CBGs improving. Will continue IV insulin until acidosis resolved as MD determines (CO2 >20 and AG < 10-12).  Insulin - Basal: Once MD is ready to transition from IV to SQ insulin, please consider ordering Lantus 19 units Q24H (based on 63 kg x 0.3 units). Correction (SSI): Once MD is ready to transition from IV to SQ insulin, please consider ordering CBGs with Novolog 0-9 units TID with meals and Novolog 0-5 units QHS. Insulin - Meal Coverage: Once MD is ready to transition from IV to SQ insulin, please consider ordering Novolog 4 units TID with meals for meal coverage if patient eats at least 50% of meals. HgbA1C: Please consider adding an A1C on to blood in lab or draw with next scheduled blood draw to evaluate glycemic control over the  past 2-3 months.  Addendum 01/15/17@14 :40-Spoke with patient about diabetes and home regimen for diabetes control. Patient reports that she moved to [facility] from [place] and that she last seen her Endocrinologist in [place] in June. Patient states that she has established care with a local PCP but she has not seen a local Endocrinologist. Patient states that her PCP is out of the office until 01/22/17 and that she plans to call and ask for a referral to a local Endocrinologist for diabetes management. Patient states that she was taking Lantus 22 units QHS and Novolog for meal coverage and correction. Patient states that she went to [place] to take her son to visit his father and she forgot to bring her Lantus insulin back and that was all the Lantus she had. Patient states that she got a bottle of NPH but she was not sure how to use NPH because it has been so long since she used NPH. Discussed Lantus and NPH insulin. Patient reports that she was on the [facility] but changed over from the pump to SQ insulin about 2 1/2 months ago. Patient states that when she takes her insulin as prescribed, her glucose is the mid 100's mg/dl to lower 891'Q mg/dl. Inquired about prior A1C and patient reports that she does not recall her last A1C value. Informed patient that her A1C has been drawn but the results are still in process.  Discussed glucose and A1C goals. Discussed importance of checking CBGs and maintaining good CBG control to prevent long-term and short-term  complications. Patient reports that she will need prescriptions for Lantus and Novolog insulin pens at time of discharge. Patient states that she has insurance and that she should not have any issues with getting insulins refilled. Encouraged patient to follow up with PCP and to get a referral to a local Endocrinologist to establish care with for DM management.  Patient verbalized understanding of information discussed and she states that she has no further questions at  this time related to diabetes.  Thanks, Orlando Penner, RN, MSN, CDE Diabetes Coordinator Inpatient Diabetes Program [phone] (Team Pager from 8am to 5pm)

## 2017-01-16 ENCOUNTER — Encounter (HOSPITAL_COMMUNITY): Payer: Self-pay | Admitting: *Deleted

## 2017-01-16 DIAGNOSIS — E0810 Diabetes mellitus due to underlying condition with ketoacidosis without coma: Secondary | ICD-10-CM

## 2017-01-16 DIAGNOSIS — E1010 Type 1 diabetes mellitus with ketoacidosis without coma: Principal | ICD-10-CM

## 2017-01-16 DIAGNOSIS — E875 Hyperkalemia: Secondary | ICD-10-CM

## 2017-01-16 DIAGNOSIS — N179 Acute kidney failure, unspecified: Secondary | ICD-10-CM

## 2017-01-16 LAB — BASIC METABOLIC PANEL
ANION GAP: 11 (ref 5–15)
BUN: <5 mg/dL — ABNORMAL LOW (ref 6–20)
CHLORIDE: 104 mmol/L (ref 101–111)
CO2: 24 mmol/L (ref 22–32)
Calcium: 7.6 mg/dL — ABNORMAL LOW (ref 8.9–10.3)
Creatinine, Ser: 0.66 mg/dL (ref 0.44–1.00)
GFR calc non Af Amer: >60 mL/min (ref >60)
Glucose, Bld: 168 mg/dL — ABNORMAL HIGH (ref 65–99)
POTASSIUM: 3.8 mmol/L (ref 3.5–5.1)
SODIUM: 139 mmol/L (ref 135–145)

## 2017-01-16 LAB — GLUCOSE, CAPILLARY
GLUCOSE-CAPILLARY: 115 mg/dL — AB (ref 65–99)
GLUCOSE-CAPILLARY: 117 mg/dL — AB (ref 65–99)
GLUCOSE-CAPILLARY: 127 mg/dL — AB (ref 65–99)
GLUCOSE-CAPILLARY: 168 mg/dL — AB (ref 65–99)
Glucose-Capillary: 181 mg/dL — ABNORMAL HIGH (ref 65–99)

## 2017-01-16 LAB — CBC
HCT: 34.4 % — ABNORMAL LOW (ref 36.0–46.0)
Hemoglobin: 11.6 g/dL — ABNORMAL LOW (ref 12.0–15.0)
MCH: 28.8 pg (ref 26.0–34.0)
MCHC: 33.7 g/dL (ref 30.0–36.0)
MCV: 85.4 fL (ref 78.0–100.0)
Platelets: 301 10*3/uL (ref 150–400)
RBC: 4.03 MIL/uL (ref 3.87–5.11)
RDW: 13.2 % (ref 11.5–15.5)
WBC: 7.5 10*3/uL (ref 4.0–10.5)

## 2017-01-16 LAB — MAGNESIUM: Magnesium: 1.6 mg/dL — ABNORMAL LOW (ref 1.7–2.4)

## 2017-01-16 LAB — PHOSPHORUS: Phosphorus: 1 mg/dL — CL (ref 2.5–4.6)

## 2017-01-16 MED ORDER — INSULIN GLARGINE 100 UNIT/ML ~~LOC~~ SOLN
22.0000 [IU] | Freq: Every day | SUBCUTANEOUS | 0 refills | Status: DC
Start: 2017-01-16 — End: 2021-06-30

## 2017-01-16 MED ORDER — INSULIN ASPART 100 UNIT/ML ~~LOC~~ SOLN: SUBCUTANEOUS | 0 refills | Status: DC

## 2017-01-16 MED ORDER — K PHOS MONO-SOD PHOS DI & MONO 155-852-130 MG PO TABS
250.0000 mg | ORAL_TABLET | Freq: Once | ORAL | Status: DC
  Filled 2017-01-16: qty 1

## 2017-01-16 MED ORDER — NEEDLES & SYRINGES MISC: 0 refills | Status: DC

## 2017-01-16 NOTE — Discharge Summary (Addendum)
Physician Discharge Summary  Jeanne Mosley ZHY:[id number] DOB: May 12, 1986 DOA: 01/14/2017  PCP: Patient, No Pcp Per  Admit date: 01/14/2017 Discharge date: 01/16/2017  Admitted From: Home  Disposition:  Home   Recommendations for Outpatient Follow-up:  1. Follow up with PCP in 1- weeks 2. Patient instructed to resume home regimen of insulin 3. Advised to be compliant with insulin regimen.   Home Health: No  Equipment/Devices: No   Discharge Condition Stable  CODE STATUS: Full  Diet recommendation: Carb Modified   Brief/Interim Summary: 31 year old female who presented with chest pain, abdominal pain, nonproductive cough, nausea and vomiting. Patient is known to have type 1 diabetes mellitus and multiple sclerosis. She was on a road trip and run out off her long-acting insulin, with a consequent of uncontrolled hyperglycemia. On initial physical examination, she was somnolent, blood pressure 134/81, heart rate 130, respiratory rate 19, temperature 97.3, oxygen saturation 100%, dry oral mucosa, lungs clear to auscultation bilaterally, heart S1-S2 present tachycardic, abdomen soft nontender, positive bowel sounds, no lower extremity edema. Sodium 134, potassium 5.6, chloride 101, glucose 521, BUN 20, creatinine 1.66, anion gap of 31.6, white count 16.6, hemoglobin 14.6, hematocrit 45.8, platelets 445, urinalysis greater than 500 glucose, 100 protein, white cells 0-5, HCG negative. Venous pH 6.89. Chest x-ray negative for infiltrates, low lung volumes to hypoinflation. EKG sinus rhythm, poor R-wave progression.  Patient was admitted to the stepdown unit with the working diagnosis of diabetes ketoacidosis complicated by metabolic encephalopathy, acute kidney injury and hyperkalemia.  1. Diabetes ketoacidosis. Patient was admitted to the intensive care unit, she was placed on continuous infusion of IV insulin, with improvement of her anion gap and serum glucose. Due to her severe metabolic  acidosis she was placed on a short course of IV bicarbonate. She was transitioned successfully to subcutaneous insulin, with no major complications. By the time of discharge she is tolerating regular by mouth diet. Fasting serum glucose 160, anion gap 11. Patient was advised about compliance with insulin regimen.  2. Metabolic encephalopathy. She responded well to medical therapy her acidosis improved along with her mentation. By the time of discharge she is nonfocal and stable.   3. Acute kidney injury with hyperkalemia. Patient received IV fluids with improvement of kidney function and electrolytes, discharge creatinine 0.66, potassium 3.8. Patient tolerating by mouth diet adequately. Her discharge serum bicarbonate is 24, she did receive bicarbonate infusion during the first few hours of hospitalization due to her severe metabolic acidosis.   4. Type 1 diabetes mellitus. At home she reports to have well-controlled diabetes mellitus, she uses 22 units of insulin Lantus at night and sliding scale during the day. Her insulin short-acting, no lock was refilled, she does have insulin Lantus at home. She was advised about the importance of basal long-acting insulin therapy.    Discharge Diagnoses:  Active Problems:   DKA (diabetic ketoacidoses) (HCC)    Discharge Instructions   Allergies as of 01/16/2017      Reactions   Sulfa Antibiotics Hives   hives   Sweetness Enhancer Rash      Medication List    STOP taking these medications   insulin NPH Human 100 UNIT/ML injection Commonly known as:  HUMULIN N,NOVOLIN N     TAKE these medications   insulin aspart 100 UNIT/ML injection Commonly known as:  novoLOG Glucose 121-150 use 2 units, 151-200 use 4 units, 201-250 use 6 units, 251 to 300 use 8 units, 301 to 350 use 10 units, 351 or grater  use 12 units. What changed:  how much to take  how to take this  when to take this  additional instructions   insulin glargine 100 UNIT/ML  injection Commonly known as:  LANTUS Inject 0.22 mLs (22 Units total) into the skin at bedtime.   Needles & Syringes Misc Please give insulin syringes and needles.       Allergies  Allergen Reactions  . Sulfa Antibiotics Hives    hives  . Sweetness Enhancer Rash    Consultations:  Critical care   Procedures/Studies: Dg Chest Port 1 View  Result Date: 01/15/2017 CLINICAL DATA:  31 y/o  F; DKA 8 tonight. EXAM: PORTABLE CHEST 1 VIEW COMPARISON:  None. FINDINGS: Low lung volumes accentuate pulmonary markings. Cardiac silhouette within normal limits given projection and technique. Clear lungs. No pleural effusion or pneumothorax. Bones are unremarkable. IMPRESSION: Low lung volumes.  No acute pulmonary process identified. Electronically Signed   By: Mitzi Hansen M.D.   On: 01/15/2017 01:03       Subjective: Patient is feeling well, no nausea, no vomiting, no dyspnea, tolerating by mouth diet adequate.   Discharge Exam: Vitals:   01/16/17 0800 01/16/17 0900  BP: 103/75 117/80  Pulse: 89 96  Resp: 15 17  Temp:    SpO2: 99% 100%   Vitals:   01/16/17 0500 01/16/17 0723 01/16/17 0800 01/16/17 0900  BP:   103/75 117/80  Pulse:   89 96  Resp:   15 17  Temp:  98 F (36.7 C)    TempSrc:  Oral    SpO2:   99% 100%  Weight:      Height: 5\' 2"  (1.575 m)       General: Pt is alert, awake, not in acute distress E ENT moist mucous membranes, no pallor or icterus Cardiovascular: RRR, S1/S2 +, no rubs, no gallops Respiratory: CTA bilaterally, no wheezing, no rhonchi Abdominal: Soft, NT, ND, bowel sounds + Extremities: no edema, no cyanosis    The results of significant diagnostics from this hospitalization (including imaging, microbiology, ancillary and laboratory) are listed below for reference.     Microbiology: Recent Results (from the past 240 hour(s))  MRSA PCR Screening     Status: None   Collection Time: 01/15/17  2:59 AM  Result Value Ref Range  Status   MRSA by PCR NEGATIVE NEGATIVE Final    Comment:        The GeneXpert MRSA Assay (FDA approved for NASAL specimens only), is one component of a comprehensive MRSA colonization surveillance program. It is not intended to diagnose MRSA infection nor to guide or monitor treatment for MRSA infections.      Labs: BNP (last 3 results) No results for input(s): BNP in the last 8760 hours. Basic Metabolic Panel:  Recent Labs Lab 01/15/17 0231 01/15/17 1219 01/15/17 1800 01/15/17 2253 01/16/17 0526  NA 139 143 137 140 139  K 5.5* 2.9* 2.9* 3.1* 3.8  CL 109 109 101 104 104  CO2 11* 25 25 25 24   GLUCOSE 241* 110* 188* 147* 168*  BUN 17 8 7  5* <5*  CREATININE 1.46* 0.74 0.73 0.65 0.66  CALCIUM 8.4* 7.2* 7.4* 7.3* 7.6*  MG 2.1  --   --   --  1.6*  PHOS 1.8*  --   --   --  1.0*   Liver Function Tests:  Recent Labs Lab 01/14/17 1936 01/15/17 0231  AST 30 39  ALT 25 23  ALKPHOS 94 73  BILITOT 1.4*  2.3*  PROT 9.2* 7.1  ALBUMIN 4.7 3.8    Recent Labs Lab 01/14/17 1936  LIPASE 25   No results for input(s): AMMONIA in the last 168 hours. CBC:  Recent Labs Lab 01/14/17 1936  01/14/17 2223 01/14/17 2305 01/15/17 0055 01/15/17 0344 01/16/17 0526  WBC 16.6*  --   --   --   --  12.2* 7.5  NEUTROABS  --   --   --   --   --  8.0*  --   HGB 14.6  < > 16.0* 15.0 14.3 11.6* 11.6*  HCT 45.8  < > 47.0* 44.0 42.0 35.7* 34.4*  MCV 92.3  --   --   --   --  87.9 85.4  PLT 445*  --   --   --   --  304 301  < > = values in this interval not displayed. Cardiac Enzymes: No results for input(s): CKTOTAL, CKMB, CKMBINDEX, TROPONINI in the last 168 hours. BNP: Invalid input(s): POCBNP CBG:  Recent Labs Lab 01/15/17 2156 01/15/17 2255 01/15/17 2358 01/16/17 0359 01/16/17 0720  GLUCAP 160* 141* 117* 127* 168*   D-Dimer No results for input(s): DDIMER in the last 72 hours. Hgb A1c No results for input(s): HGBA1C in the last 72 hours. Lipid Profile No results  for input(s): CHOL, HDL, LDLCALC, TRIG, CHOLHDL, LDLDIRECT in the last 72 hours. Thyroid function studies No results for input(s): TSH, T4TOTAL, T3FREE, THYROIDAB in the last 72 hours.  Invalid input(s): FREET3 Anemia work up No results for input(s): VITAMINB12, FOLATE, FERRITIN, TIBC, IRON, RETICCTPCT in the last 72 hours. Urinalysis    Component Value Date/Time   COLORURINE STRAW (A) 01/14/2017 1959   APPEARANCEUR HAZY (A) 01/14/2017 1959   LABSPEC 1.015 01/14/2017 1959   PHURINE 5.0 01/14/2017 1959   GLUCOSEU >=500 (A) 01/14/2017 1959   HGBUR MODERATE (A) 01/14/2017 1959   BILIRUBINUR NEGATIVE 01/14/2017 1959   KETONESUR 80 (A) 01/14/2017 1959   PROTEINUR 100 (A) 01/14/2017 1959   NITRITE NEGATIVE 01/14/2017 1959   LEUKOCYTESUR NEGATIVE 01/14/2017 1959   Sepsis Labs Invalid input(s): PROCALCITONIN,  WBC,  LACTICIDVEN Microbiology Recent Results (from the past 240 hour(s))  MRSA PCR Screening     Status: None   Collection Time: 01/15/17  2:59 AM  Result Value Ref Range Status   MRSA by PCR NEGATIVE NEGATIVE Final    Comment:        The GeneXpert MRSA Assay (FDA approved for NASAL specimens only), is one component of a comprehensive MRSA colonization surveillance program. It is not intended to diagnose MRSA infection nor to guide or monitor treatment for MRSA infections.      Time coordinating discharge: 45 minutes  SIGNED:   Coralie Keens, MD  [facility] 01/16/2017, 9:53 AM Pager   If 7PM-7AM, please contact night-coverage [email or web address] Password [email or web address]

## 2017-01-16 NOTE — Progress Notes (Signed)
Discharged to home with patient's sister.

## 2017-01-16 NOTE — Discharge Instructions (Signed)
Correction Insulin Correction insulin, also called corrective insulin or a supplemental dose, is a small amount of insulin that can be used to lower your blood sugar (glucose) if it is too high. You may be instructed to check your blood glucose at certain times of the day and to use correction insulin as needed to lower your blood glucose to your target range. Correction insulin is primarily used as part of diabetes management. It may also be prescribed for people who do not have diabetes. What is a correction scale? A correction scale, also called a sliding scale, is prescribed by your health care provider to help you determine when you need correction insulin. Your correction scale is based on your individual treatment goals, and it has two parts:  Ranges of blood glucose levels.  How much correction insulin to give yourself if your blood sugar falls within a certain range.  If your blood glucose is in your desired range, you will not need correction insulin and you should take your normal insulin dose. What type of insulin do I need? Your health care provider may prescribe rapid-acting or short-acting insulin for you to use as correction insulin. Rapid-acting insulin:  Starts working quickly, in as little as 5 minutes.  Can last for 3-6 hours.  Works well when taken right before a meal to quickly lower blood glucose. Short-acting insulin:  Starts working in about 30 minutes.  Can last for 6-8 hours.  Should be taken about 30 minutes before you start eating a meal. Talk with your health care provider or pharmacist about which type of correction insulin to take and when to take it. If you use insulin to control your diabetes, you should use correction insulin in addition to the longer-acting (basal) insulin that you normally use. How do I manage my blood glucose with correction insulin? Giving a correction dose  Check your blood glucose as directed by your health care provider.  Use  your correction scale to find the range that your blood glucose is in.  Identify the units of insulin that match your blood glucose range.  Make sure you have food available that you can eat in the next 15-30 minutes, after your correction dose.  Give yourself the dose of correction insulin that your health care provider has prescribed in your correction scale. Always make sure you are using the right type of insulin. ? If your correction insulin is rapid-acting, start eating a meal within 15 minutes after your correction dose to keep your blood glucose from getting too low. ? If your correction insulin is short-acting, start eating a meal within 30 minutes after your correction dose to keep your blood glucose from getting too low. Keeping a blood glucose log  Write down your blood glucose test results and the amount of insulin that you give yourself. Do this every time you check blood glucose or take insulin. Bring this log with you to your medical visits. This information will help your health care provider to manage your medicines.  Note anything that may affect your blood glucose, such as: ? Changes in normal exercise or activity. ? Changes in your normal schedule, such as changes in your sleep routine, going on vacation, changing your diet, or holidays. ? New over-the-counter or prescription medicines. ? Illness, stress, or anxiety. ? Changes in the time that you took your medicine or insulin. ? Changes in your meals, such as skipping a meal, having a late meal, or dining out. ? Eating things  that may affect blood glucose, such as snacks, meal portions that are larger than normal, drinks that contain sugar, or eating less than usual. What do I need to know about hyperglycemia and hypoglycemia? What is hyperglycemia? Hyperglycemia, also called high blood glucose, occurs when blood glucose is too high. Make sure you know the early signs of hyperglycemia, such as:  Increased  thirst.  Hunger.  Feeling very tired.  Needing to urinate more often than usual.  Blurry vision.  What is hypoglycemia? Hypoglycemia is also called low blood glucose. Be aware of stacking your insulin doses. This happens when you correct a high blood glucose by giving yourself extra insulin too soon after a previous correction dose or mealtime dose. This may cause you to have too much insulin in your body and may put you at risk for hypoglycemia. Hypoglycemia occurs with a blood glucose level at or below 70 mg/dL (3.9 mmol/L). It is important to know the symptoms of hypoglycemia and treat it right away. Always have a 15-gram rapid-acting carbohydrate snack with you to treat low blood glucose. Family members and close friends should also know the symptoms and should understand how to treat hypoglycemia, in case you are not able to treat yourself. What are the symptoms of hypoglycemia? Hypoglycemia symptoms can include:  Hunger.  Anxiety.  Sweating and feeling clammy.  Confusion.  Dizziness or light-headedness.  Sleepiness.  Nausea.  Increased heart rate.  Headache.  Blurry vision.  Jerky movements that you cannot control (seizure).  Nightmares.  Tingling or numbness around the mouth, lips, or tongue.  A change in speech.  Decreased ability to concentrate.  A change in coordination.  Restless sleep.  Tremors or shakes.  Fainting.  Irritability.  How do I treat hypoglycemia? If you are alert and able to swallow safely, follow the 15:15 rule:  Take 15 grams of a rapid-acting carbohydrate. Rapid-acting options include: ? 1 tube of glucose gel. ? 3 glucose pills. ? 6-8 pieces of hard candy. ? 4 oz (120 mL) of fruit juice. ? 4 oz (120 mL) of regular (not diet) soda.  Check your blood glucose 15 minutes after you take the carbohydrate. ? If the repeat blood glucose level is still at or below 70 mg/dL (3.9 mmol/L), take 15 grams of a carbohydrate  again. ? If your blood glucose level does not increase above 70 mg/dL (3.9 mmol/L) after 3 tries, seek emergency medical care.  After your blood glucose level returns to normal, eat a meal or a snack within 1 hour.  How do I treat severe hypoglycemia? Severe hypoglycemia is when your blood glucose level is at or below 54 mg/dL (3 mmol/L). Severe hypoglycemia is an emergency. Do not wait to see if the symptoms will go away. Get medical help right away. Call your local emergency services (911 in the [place] Do not drive yourself to the hospital. If you have severe hypoglycemia and you cannot eat or drink, you may need an injection of glucagon. A family member or close friend should learn how to check your blood glucose and how to give you a glucagon injection. Ask your health care provider if you need to have an emergency glucagon injection kit available. Severe hypoglycemia may need to be treated in a hospital. The treatment may include getting glucose through an IV tube. You may also need treatment for the cause of your hypoglycemia. Why do I need correction insulin if I do not have diabetes? If you do not have diabetes,  diabetes, your health care provider may prescribe insulin because: °· Keeping your blood glucose in the target range is important for your overall health. °· You are taking medicines that cause your blood glucose to be higher than normal. ° °Contact a health care provider if: °· You develop low blood glucose that you are not able to treat yourself. °· You have high blood glucose that you are not able to correct with correction insulin. °· Your blood glucose is often too low. °· You used emergency glucagon to treat low blood glucose. °Get help right away if: °· You become unresponsive. If this happens, someone else should call emergency services (911 in the [place] right away. °· Your blood glucose is lower than 54 mg/dL (3.0 mmol/L). °· You become confused or you have trouble thinking  clearly. °· You have difficulty breathing. °Summary °· Correction insulin is a small amount of insulin that can be used to lower your blood sugar (glucose) if it is too high. °· Talk with your health care provider or pharmacist about which type of correction insulin to take and when to take it. If you use insulin to control your diabetes, you should use correction insulin in addition to the longer-acting (basal) insulin that you normally use. °· You may be instructed to check your blood glucose at certain times of the day and to use correction insulin as needed to lower your blood glucose to your target range. Always keep a log of your blood glucose values and the amount of insulin that you used. °· It is important to know the symptoms of hypoglycemia and treat it right away. Always have a 15-gram rapid-acting carbohydrate snack with you to treat low blood glucose. Family members and close friends should also know the symptoms and should understand how to treat hypoglycemia, in case you are not able to treat yourself. °This information is not intended to replace advice given to you by your health care provider. Make sure you discuss any questions you have with your health care provider. °Document Released: 10/13/2010 Document Revised: 02/18/2016 Document Reviewed: 02/18/2016 °Elsevier Interactive Patient Education © 2018 [employer] °Blood Glucose Monitoring, Adult °Monitoring your blood sugar (glucose) helps you manage your diabetes. It also helps you and your health care provider determine how well your diabetes management plan is working. Blood glucose monitoring involves checking your blood glucose as often as directed, and keeping a record (log) of your results over time. °Why should I monitor my blood glucose? °Checking your blood glucose regularly can: °· Help you understand how food, exercise, illnesses, and medicines affect your blood glucose. °· Let you know what your blood glucose is at any time. You  can quickly tell if you are having low blood glucose (hypoglycemia) or high blood glucose (hyperglycemia). °· Help you and your health care provider adjust your medicines as needed. ° °When should I check my blood glucose? °Follow instructions from your health care provider about how often to check your blood glucose. This may depend on: °· The type of diabetes you have. °· How well-controlled your diabetes is. °· Medicines you are taking. ° °If you have type 1 diabetes: °· Check your blood glucose at least 2 times a day. °· Also check your blood glucose: °? Before every insulin injection. °? Before and after exercise. °? Between meals. °? 2 hours after a meal. °? Occasionally between 2:00 a.m. and 3:00 a.m., as directed. °? Before potentially dangerous tasks, like driving or using heavy machinery. °?   At bedtime. °· You may need to check your blood glucose more often, up to 6-10 times a day: °? If you use an insulin pump. °? If you need multiple daily injections (MDI). °? If your diabetes is not well-controlled. °? If you are ill. °? If you have a history of severe hypoglycemia. °? If you have a history of not knowing when your blood glucose is getting low (hypoglycemia unawareness). °If you have type 2 diabetes: °· If you take insulin or other diabetes medicines, check your blood glucose at least 2 times a day. °· If you are on intensive insulin therapy, check your blood glucose at least 4 times a day. Occasionally, you may also need to check between 2:00 a.m. and 3:00 a.m., as directed. °· Also check your blood glucose: °? Before and after exercise. °? Before potentially dangerous tasks, like driving or using heavy machinery. °· You may need to check your blood glucose more often if: °? Your medicine is being adjusted. °? Your diabetes is not well-controlled. °? You are ill. °What is a blood glucose log? °· A blood glucose log is a record of your blood glucose readings. It helps you and your health care  provider: °? Look for patterns in your blood glucose over time. °? Adjust your diabetes management plan as needed. °· Every time you check your blood glucose, write down your result and notes about things that may be affecting your blood glucose, such as your diet and exercise for the day. °· Most glucose meters store a record of glucose readings in the meter. Some meters allow you to download your records to a computer. °How do I check my blood glucose? °Follow these steps to get accurate readings of your blood glucose: °Supplies needed ° °· Blood glucose meter. °· Test strips for your meter. Each meter has its own strips. You must use the strips that come with your meter. °· A needle to prick your finger (lancet). Do not use lancets more than once. °· A device that holds the lancet (lancing device). °· A journal or log book to write down your results. °Procedure °· Wash your hands with soap and water. °· Prick the side of your finger (not the tip) with the lancet. Use a different finger each time. °· Gently rub the finger until a small drop of blood appears. °· Follow instructions that come with your meter for inserting the test strip, applying blood to the strip, and using your blood glucose meter. °· Write down your result and any notes. °Alternative testing sites °· Some meters allow you to use areas of your body other than your finger (alternative sites) to test your blood. °· If you think you may have hypoglycemia, or if you have hypoglycemia unawareness, do not use alternative sites. Use your finger instead. °· Alternative sites may not be as accurate as the fingers, because blood flow is slower in these areas. This means that the result you get may be delayed, and it may be different from the result that you would get from your finger. °· The most common alternative sites are: °? Forearm. °? Thigh. °? Palm of the hand. °Additional tips °· Always keep your supplies with you. °· If you have questions or need  help, all blood glucose meters have a 24-hour “hotline” number that you can call. You may also contact your health care provider. °· After you use a few boxes of test strips, adjust (calibrate) your blood glucose meter by   following instructions that came with your meter. This information is not intended to replace advice given to you by your health care provider. Make sure you discuss any questions you have with your health care provider. Document Released: 05/25/2003 Document Revised: 12/10/2015 Document Reviewed: 11/01/2015 Elsevier Interactive Patient Education  2017 [employer].

## 2017-01-16 NOTE — Care Management Note (Signed)
Case Management Note  Patient Details  Name: Kelynn Martinz MRN: [id number] Date of Birth: [birth year]  Subjective/Objective:  From home presents with DKA ,pta indep has hx of MS. No insurance or PCP listed.  Patient gave insurance information to NCM and NCM gave to Admission to enter into system, she discharged home, no needs.                  Action/Plan:   Expected Discharge Date:  01/16/17               Expected Discharge Plan:  Home/Self Care  In-House Referral:     Discharge planning Services  CM Consult  Post Acute Care Choice:    Choice offered to:     DME Arranged:    DME Agency:     HH Arranged:    HH Agency:     Status of Service:  Completed, signed off  If discussed at [employer] of Stay Meetings, dates discussed:    Additional Comments:  Leone Haven, RN 01/16/2017, 12:24 PM

## 2017-01-16 NOTE — Progress Notes (Signed)
Notified Dr. Ella Jubilee of critical phosphorous of 1.0

## 2017-01-16 NOTE — Progress Notes (Addendum)
Inpatient Diabetes Program Recommendations  AACE/ADA: New Consensus Statement on Inpatient Glycemic Control (2015)  Target Ranges:  Prepandial:   less than 140 mg/dL      Peak postprandial:   less than 180 mg/dL (1-2 hours)      Critically ill patients:  140 - 180 mg/dL  Results for Jeanne Mosley, Jeanne Mosley (MRN [id number]) as of 01/16/2017 09:03  Ref. Range 01/15/2017 18:53 01/15/2017 21:02 01/15/2017 21:56 01/15/2017 22:55 01/15/2017 23:58 01/16/2017 03:59 01/16/2017 07:20  Glucose-Capillary Latest Ref Range: 65 - 99 mg/dL 222 (H) 93 979 (H) 892 (H) 117 (H) 127 (H) 168 (H)    Review of Glycemic Control  Diabetes history: DM1 (makes no insulin; will need basal, correction, and meal coverage) Outpatient Diabetes medications: Lantus 22 units QHS, Novolog 6 units with meals plus correction Current orders for Inpatient glycemic control: Lantus 15 units QHS, Novolog 0-15 units Q4H  Inpatient Diabetes Program Recommendations:  Insulin - Basal:Please consider increasing Lantus to 17 units QHS. Correction (SSI): Patient has DM1 and is sensitive to insulin. Please consider decreasing Novolog correction to sensitive scale (0-9 units TID with meals) and adding Novolog 0-5 units QHS. Insulin - Meal Coverage: Please consider ordering Novolog 3 units TID with meals for meal coverage if patient eats at least 50% of meals. HgbA1C: Please consider adding an A1C on to blood in lab or draw with next scheduled blood draw to evaluate glycemic control over the past 2-3 months.  Thanks, Orlando Penner, RN, MSN, CDE Diabetes Coordinator Inpatient Diabetes Program [phone] (Team Pager from 8am to 5pm)

## 2017-02-16 ENCOUNTER — Ambulatory Visit: Payer: BLUE CROSS/BLUE SHIELD | Admitting: Podiatry

## 2017-02-23 ENCOUNTER — Ambulatory Visit: Payer: BLUE CROSS/BLUE SHIELD | Admitting: Podiatry

## 2017-03-07 ENCOUNTER — Ambulatory Visit: Payer: BLUE CROSS/BLUE SHIELD | Admitting: Podiatry

## 2017-03-21 ENCOUNTER — Ambulatory Visit: Payer: BLUE CROSS/BLUE SHIELD | Admitting: Neurology

## 2017-03-21 ENCOUNTER — Telehealth: Payer: Self-pay | Admitting: Neurology

## 2017-03-21 NOTE — Telephone Encounter (Signed)
This patient did not show for a new patient appointment today. 

## 2017-03-22 ENCOUNTER — Encounter: Payer: Self-pay | Admitting: Neurology

## 2018-08-14 DIAGNOSIS — K5909 Other constipation: Secondary | ICD-10-CM | POA: Insufficient documentation

## 2018-09-30 DIAGNOSIS — B977 Papillomavirus as the cause of diseases classified elsewhere: Secondary | ICD-10-CM | POA: Insufficient documentation

## 2018-09-30 DIAGNOSIS — R87610 Atypical squamous cells of undetermined significance on cytologic smear of cervix (ASC-US): Secondary | ICD-10-CM | POA: Insufficient documentation

## 2018-09-30 DIAGNOSIS — E113599 Type 2 diabetes mellitus with proliferative diabetic retinopathy without macular edema, unspecified eye: Secondary | ICD-10-CM | POA: Insufficient documentation

## 2018-10-03 ENCOUNTER — Ambulatory Visit: Payer: BLUE CROSS/BLUE SHIELD | Admitting: Obstetrics & Gynecology

## 2019-01-03 DIAGNOSIS — J302 Other seasonal allergic rhinitis: Secondary | ICD-10-CM | POA: Insufficient documentation

## 2019-01-09 ENCOUNTER — Ambulatory Visit: Payer: Managed Care, Other (non HMO) | Admitting: Podiatry

## 2019-01-11 ENCOUNTER — Emergency Department (HOSPITAL_COMMUNITY)
Admission: EM | Admit: 2019-01-11 | Discharge: 2019-01-11 | Disposition: A | Discharge status: Home / Self Care | Payer: Managed Care, Other (non HMO) | Attending: Emergency Medicine | Admitting: Emergency Medicine

## 2019-01-11 ENCOUNTER — Encounter (HOSPITAL_COMMUNITY): Payer: Self-pay

## 2019-01-11 ENCOUNTER — Other Ambulatory Visit: Payer: Self-pay

## 2019-01-11 ENCOUNTER — Emergency Department (HOSPITAL_COMMUNITY): Payer: Managed Care, Other (non HMO)

## 2019-01-11 DIAGNOSIS — Z79899 Other long term (current) drug therapy: Secondary | ICD-10-CM | POA: Diagnosis not present

## 2019-01-11 DIAGNOSIS — J029 Acute pharyngitis, unspecified: Secondary | ICD-10-CM | POA: Diagnosis not present

## 2019-01-11 DIAGNOSIS — G35 Multiple sclerosis: Secondary | ICD-10-CM | POA: Diagnosis not present

## 2019-01-11 DIAGNOSIS — H10401 Unspecified chronic conjunctivitis, right eye: Secondary | ICD-10-CM | POA: Diagnosis not present

## 2019-01-11 DIAGNOSIS — Z794 Long term (current) use of insulin: Secondary | ICD-10-CM | POA: Diagnosis not present

## 2019-01-11 DIAGNOSIS — H1031 Unspecified acute conjunctivitis, right eye: Secondary | ICD-10-CM

## 2019-01-11 DIAGNOSIS — E119 Type 2 diabetes mellitus without complications: Secondary | ICD-10-CM | POA: Diagnosis not present

## 2019-01-11 LAB — GROUP A STREP BY PCR: Group A Strep by PCR: NOT DETECTED

## 2019-01-11 LAB — CBG MONITORING, ED: Glucose-Capillary: 229 mg/dL — ABNORMAL HIGH (ref 70–99)

## 2019-01-11 MED ORDER — ONDANSETRON 4 MG PO TBDP
8.0000 mg | ORAL_TABLET | Freq: Once | ORAL | Status: AC
  Administered 2019-01-11: 8 mg via ORAL
  Filled 2019-01-11: qty 2

## 2019-01-11 MED ORDER — ACETAMINOPHEN-CODEINE 120-12 MG/5ML PO SOLN: 10.0000 mL | ORAL | 0 refills | Status: DC | PRN

## 2019-01-11 MED ORDER — POLYMYXIN B-TRIMETHOPRIM 10000-0.1 UNIT/ML-% OP SOLN: 2.0000 [drp] | OPHTHALMIC | 0 refills | Status: DC

## 2019-01-11 NOTE — ED Notes (Signed)
Patient transported to X-ray 

## 2019-01-11 NOTE — ED Triage Notes (Signed)
Patient complains of right eye drainage and pain since am, denies trauma. Reports some blurriness to same

## 2019-01-11 NOTE — Discharge Instructions (Addendum)
Return here as needed.  Your strep test and chest x-ray did not show any abnormality.  Increase your fluid intake and rest as much as possible.  Follow-up with your doctor.

## 2019-01-13 ENCOUNTER — Telehealth: Payer: Self-pay | Admitting: *Deleted

## 2019-01-13 ENCOUNTER — Ambulatory Visit: Payer: Managed Care, Other (non HMO) | Admitting: Podiatry

## 2019-01-13 NOTE — Telephone Encounter (Signed)
Received call from [facility], [place], Pt is allergic to eyedrop prescribed, trimethoprim-polymyxin. Message sent to ED provider, Irena Cords PA for a different abx. Jonnie Finner RN CCM Case Mgmt phone [phone]

## 2019-01-13 NOTE — Telephone Encounter (Signed)
Spoke to [employer] PA, change Polytrim to Erythromycin ophthalmic bid in affected eye x 7 days. Called Rx into [facility]. Notified pt that Rx was updated.

## 2019-01-14 NOTE — ED Provider Notes (Signed)
[facility] EMERGENCY DEPARTMENT Provider Note   CSN: [id number] Arrival date & time: 01/11/19  1324     History   Chief Complaint No chief complaint on file.   HPI Jeanne Mosley is a 33 y.o. female.     HPI Patient presents to the emergency department with right eye drainage that started yesterday.  The patient states that there is a little bit of discomfort with that as well.  She states she has had no other symptoms.  Patient states she did not take any medications prior to arrival for her symptoms.  The patient denies chest pain, shortness of breath, headache,blurred vision, neck pain, fever, cough, weakness, numbness, dizziness, anorexia, edema, abdominal pain, nausea, vomiting, diarrhea, rash, back pain, dysuria, hematemesis, bloody stool, near syncope, or syncope. Past Medical History:  Diagnosis Date  . Diabetes mellitus without complication (HCC)   . MS (multiple sclerosis) (HCC)     Patient Active Problem List   Diagnosis Date Noted  . DKA (diabetic ketoacidoses) (HCC) 01/15/2017    Past Surgical History:  Procedure Laterality Date  . EYE SURGERY       OB History   No obstetric history on file.      Home Medications    Prior to Admission medications   Medication Sig Start Date End Date Taking? Authorizing Provider  acetaminophen-codeine 120-12 MG/5ML solution Take 10 mLs by mouth every 4 (four) hours as needed for moderate pain. 01/11/19   Rasheen Bells, Cristal Deer, PA-C  insulin aspart (NOVOLOG) 100 UNIT/ML injection Glucose 121-150 use 2 units, 151-200 use 4 units, 201-250 use 6 units, 251 to 300 use 8 units, 301 to 350 use 10 units, 351 or grater use 12 units. 01/16/17 02/16/17  Arrien, York Ram, MD  insulin glargine (LANTUS) 100 UNIT/ML injection Inject 0.22 mLs (22 Units total) into the skin at bedtime. 01/16/17 02/15/17  Arrien, York Ram, MD  Needles & Syringes MISC Please give insulin syringes and needles. 01/16/17   Arrien,  York Ram, MD  trimethoprim-polymyxin b (POLYTRIM) ophthalmic solution Place 2 drops into the right eye every 4 (four) hours. 01/11/19   Charlestine Night, PA-C    Family History No family history on file.  Social History Social History   Tobacco Use  . Smoking status: Never Smoker  . Smokeless tobacco: Never Used  Substance Use Topics  . Alcohol use: Yes  . Drug use: No     Allergies   Sulfa antibiotics and Sweetness enhancer   Review of Systems Review of Systems All other systems negative except as documented in the HPI. All pertinent positives and negatives as reviewed in the HPI.  Physical Exam Updated Vital Signs BP 130/84 (BP Location: Right Arm)   Pulse (!) 103   Temp 98 F (36.7 C) (Oral)   Resp 18   SpO2 97%   Physical Exam Vitals signs and nursing note reviewed.  Constitutional:      General: She is not in acute distress.    Appearance: She is well-developed.  HENT:     Head: Normocephalic and atraumatic.  Eyes:     General:        Right eye: Discharge present.     Conjunctiva/sclera:     Right eye: Right conjunctiva is injected.     Pupils: Pupils are equal, round, and reactive to light.  Pulmonary:     Effort: Pulmonary effort is normal.  Skin:    General: Skin is warm and dry.  Neurological:  Mental Status: She is alert and oriented to person, place, and time.      ED Treatments / Results  Labs (all labs ordered are listed, but only abnormal results are displayed) Labs Reviewed  CBG MONITORING, ED - Abnormal; Notable for the following components:      Result Value   Glucose-Capillary 229 (*)    All other components within normal limits  GROUP A STREP BY PCR    EKG None  Radiology No results found.  Procedures Procedures (including critical care time)  Medications Ordered in ED Medications  ondansetron (ZOFRAN-ODT) disintegrating tablet 8 mg (8 mg Oral Given 01/11/19 0939)     Initial Impression / Assessment and  Plan / ED Course  I have reviewed the triage vital signs and the nursing notes.  Pertinent labs & imaging results that were available during my care of the patient were reviewed by me and considered in my medical decision making (see chart for details).        Patient be treated for conjunctivitis.  Told to return here as needed.  Patient agrees the plan and all questions are answered.  Patient also had a sore throat and has a negative strep screen. Final Clinical Impressions(s) / ED Diagnoses   Final diagnoses:  Sore throat  Acute bacterial conjunctivitis of right eye    ED Discharge Orders         Ordered    acetaminophen-codeine 120-12 MG/5ML solution  Every 4 hours PRN     01/11/19 1131    trimethoprim-polymyxin b (POLYTRIM) ophthalmic solution  Every 4 hours     01/11/19 [address] PA-C 01/14/19 2331    Blanchie Dessert, MD 01/15/19 [id number]

## 2019-01-21 ENCOUNTER — Telehealth: Payer: Self-pay | Admitting: Family Medicine

## 2019-01-21 NOTE — Telephone Encounter (Signed)
Attempted to reach patient about making changes to her appointment. Left a voice message. She may consider going to [facility].

## 2019-01-29 ENCOUNTER — Encounter: Payer: BLUE CROSS/BLUE SHIELD | Admitting: Family Medicine

## 2019-02-05 ENCOUNTER — Ambulatory Visit (INDEPENDENT_AMBULATORY_CARE_PROVIDER_SITE_OTHER): Payer: Managed Care, Other (non HMO) | Admitting: Podiatry

## 2019-02-05 ENCOUNTER — Other Ambulatory Visit: Payer: Self-pay

## 2019-02-05 ENCOUNTER — Encounter: Payer: Self-pay | Admitting: Podiatry

## 2019-02-05 DIAGNOSIS — M79675 Pain in left toe(s): Secondary | ICD-10-CM

## 2019-02-05 DIAGNOSIS — B351 Tinea unguium: Secondary | ICD-10-CM

## 2019-02-05 DIAGNOSIS — E109 Type 1 diabetes mellitus without complications: Secondary | ICD-10-CM

## 2019-02-05 DIAGNOSIS — M79674 Pain in right toe(s): Secondary | ICD-10-CM

## 2019-02-05 NOTE — Progress Notes (Signed)
This patient presents to the office with chief complaint of long thick nails and diabetic feet.  This patient  says there  is  no pain and discomfort in her  feet.  This patient says there are long thick painful nails.  These nails are painful walking and wearing shoes.  Patient has no history of infection or drainage from both feet.  Patient is unable to  self treat his own nails . This patient presents  to the office today for treatment of the  long nails and a foot evaluation due to history of  Diabetes. Patient has type 1 DM.  General Appearance  Alert, conversant and in no acute stress.  Vascular  Dorsalis pedis and posterior tibial  pulses are palpable  bilaterally.  Capillary return is within normal limits  bilaterally. Temperature is within normal limits  bilaterally.  Neurologic  Senn-Weinstein monofilament wire test within normal limits  bilaterally. Muscle power within normal limits bilaterally.  Nails Thick disfigured discolored nails with subungual debris  hallux nails  bilaterally. No evidence of bacterial infection or drainage bilaterally.  Orthopedic  No limitations of motion of motion feet .  No crepitus or effusions noted.  No bony pathology or digital deformities noted.  Skin  normotropic skin with no porokeratosis noted bilaterally.  No signs of infections or ulcers noted.     Onychomycosis  Diabetes with no foot complications  IE  Debride nails .  A diabetic foot exam was performed and there is no evidence of any vascular or neurologic pathology.   RTC prn.   Gardiner Barefoot DPM

## 2019-02-19 ENCOUNTER — Encounter: Payer: Self-pay | Admitting: Obstetrics and Gynecology

## 2019-02-19 ENCOUNTER — Other Ambulatory Visit: Payer: Self-pay

## 2019-02-19 ENCOUNTER — Ambulatory Visit (INDEPENDENT_AMBULATORY_CARE_PROVIDER_SITE_OTHER): Payer: Managed Care, Other (non HMO) | Admitting: Obstetrics and Gynecology

## 2019-02-19 ENCOUNTER — Ambulatory Visit: Payer: Medicaid Other | Admitting: Clinical

## 2019-02-19 VITALS — BP 115/80 | HR 87 | Wt 153.1 lb

## 2019-02-19 DIAGNOSIS — Z8759 Personal history of other complications of pregnancy, childbirth and the puerperium: Secondary | ICD-10-CM | POA: Diagnosis not present

## 2019-02-19 DIAGNOSIS — F329 Major depressive disorder, single episode, unspecified: Secondary | ICD-10-CM

## 2019-02-19 DIAGNOSIS — Z3009 Encounter for other general counseling and advice on contraception: Secondary | ICD-10-CM | POA: Diagnosis not present

## 2019-02-19 DIAGNOSIS — F32A Depression, unspecified: Secondary | ICD-10-CM | POA: Insufficient documentation

## 2019-02-19 NOTE — Progress Notes (Addendum)
Obstetrics and Gynecology New Patient Evaluation  Appointment Date: 02/19/2019  OB[facility]N Clinic[facility]  Primary Care Provider: [facility]  Referring Provider: Porfirio Oarhelle Jeffery PA  Chief Complaint: contraception management  History of Present Illness: Jeanne NashSalice Mosley is a 33 y.o. African-American [id number], seen for the above chief complaint. Her past medical history is significant for DM1, h/o c-section  GYN: Patient would like something for birth control.  She had an elective AB on 8/29 via the clinic here in [place]. She states it was a medication AB and she had bleeding and pain after taking the pills. Follow up to be for her to take home UPT in a few weeks; no u/s or period since menses.    Depression: last thoughts of SI was approximately two weeks ago; no definite plan. She states it is contributed by the recent elective termination.   Review of Systems:  as noted in the History of Present Illness.  Patient Active Problem List   Diagnosis Date Noted  . Depression 02/19/2019  . Pain due to onychomycosis of toenails of both feet 02/05/2019  . Diabetes mellitus type I (HCC) 02/05/2019  . Seasonal allergic rhinitis 01/03/2019  . Proliferative diabetic retinopathy (HCC) 09/30/2018  . Human papillomavirus in conditions classified elsewhere and of unspecified site 09/30/2018  . Atypical squamous cell changes of cervix undetermined significance favor benign 09/30/2018  . Chronic constipation 08/14/2018  . DKA (diabetic ketoacidoses) (HCC) 01/15/2017  . Multiple sclerosis (HCC) 11/24/2016  . Leukocytosis 11/24/2016  . 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 Uncontrolled type 1 diabetes mellitus with diabetic retinopathy (HCC) 11/24/2016    Past Medical History:  Past Medical History:  Diagnosis Date  . Cervical dysplasia   . Depression   . DM (diabetes mellitus), type 1 (HCC)   . MS  (multiple sclerosis) (HCC)   . UTI (urinary tract infection)     Past Surgical History:  Past Surgical History:  Procedure Laterality Date  . CERVIX LESION DESTRUCTION    . CESAREAN SECTION    . EYE SURGERY     x4    Past Obstetrical History:  OB History  Gravida Para Term Preterm AB Living  3 1 1  0 2 1  SAB TAB Ectopic Multiple Live Births  1 1 0 0 1    # Outcome Date GA Lbr Len/2nd Weight Sex Delivery Anes PTL Lv  3 TAB 02/01/19          2 Term           1 SAB             Obstetric Comments  c-section x 1. SAB x 1. EAB (medical) x 1    Past Gynecological History: As per HPI. Periods: qmonth, regular, 5 days, not heavy or painful History of Pap Smear(s): Yes.   Last pap 2020, which was negative She is currently using no method for contraception.  Has used paragard in the past but states she had pain with sex with it; she states she had it removed and no u/s done prior to removal.   Social History:  Social History   Socioeconomic History  . Marital status: Divorced    Spouse name: Not on file  . Number of children: Not on file  . Years of education: Not on file  . Highest education level: Not on file  Occupational History  . Not on file  Social Needs  . Physicist, medicalinancial resource  strain: Not on file  . Food insecurity    Worry: Not on file    Inability: Not on file  . Transportation needs    Medical: Not on file    Non-medical: Not on file  Tobacco Use  . Smoking status: Never Smoker  . Smokeless tobacco: Never Used  Substance and Sexual Activity  . Alcohol use: Yes  . Drug use: No  . Sexual activity: Not on file  Lifestyle  . Physical activity    Days per week: Not on file    Minutes per session: Not on file  . Stress: Not on file  Relationships  . Social Herbalist on phone: Not on file    Gets together: Not on file    Attends religious service: Not on file    Active member of club or organization: Not on file    Attends meetings of clubs or  organizations: Not on file    Relationship status: Not on file  . Intimate partner violence    Fear of current or ex partner: Not on file    Emotionally abused: Not on file    Physically abused: Not on file    Forced sexual activity: Not on file  Other Topics Concern  . Not on file  Social History Narrative  . Not on file    Family History: No family history on file.  Medications Jeanne Mosley had no medications administered during this visit. Current Outpatient Medications  Medication Sig Dispense Refill  . Dimethyl Fumarate 240 MG CPDR Take by mouth.    Marland Kitchen HUMALOG 100 UNIT/ML injection     . Needles & Syringes MISC Please give insulin syringes and needles. 90 each 0  . acetaminophen-codeine 120-12 MG/5ML solution Take 10 mLs by mouth every 4 (four) hours as needed for moderate pain. (Patient not taking: Reported on 02/19/2019) 200 mL 0  . insulin aspart (NOVOLOG) 100 UNIT/ML injection Glucose 121-150 use 2 units, 151-200 use 4 units, 201-250 use 6 units, 251 to 300 use 8 units, 301 to 350 use 10 units, 351 or grater use 12 units. 10 mL 0  . insulin glargine (LANTUS) 100 UNIT/ML injection Inject 0.22 mLs (22 Units total) into the skin at bedtime. 6.6 mL 0  . trimethoprim-polymyxin b (POLYTRIM) ophthalmic solution Place 2 drops into the right eye every 4 (four) hours. (Patient not taking: Reported on 02/19/2019) 10 mL 0   No current facility-administered medications for this visit.     Allergies Sulfamethoxazole-trimethoprim, Aspartame, Sulfa antibiotics, and Sweetness enhancer   Physical Exam:  BP 115/80   Pulse 87   Wt 153 lb 1.6 oz (69.4 kg)   BMI 28.00 kg/m  Body mass index is 28 kg/m. General appearance: Well nourished, well developed female in no acute distress.  Neuro/Psych:  Normal mood and affect.   Laboratory: none  Radiology: none  Assessment: pt stable  Plan:  1. History of abortion See below - US PELVIS TRANSVAGINAL NON-OB (TV ONLY); Future  2.  Depression, unspecified depression type Patient states she submitted her insurance information and is waiting to hear back from a counselor's office. She contracts for safety and patient seen by York Cerise, SW of [facility] today for introduction, offer services.   3. Encounter for other general counseling or advice on contraception Long d/w pt re: options. She doesn't desire a BTL. I told her I don't recommend estrogen containing options, so she has IUDs and depo provera available as options, although i'll  have to look into her history more to see if depo provera is acceptable per [facility] chart. She states that she doesn't anticipate sex for quite some time and is interested in the paragard. I told her I recommend an u/s to make sure no retained POCs and to make sure no fibroids or uterine anomalies that could've caused dyspareunia in the past.   RTC after u/s  Cornelia Copa MD Attending Center for [facility])

## 2019-02-19 NOTE — BH Specialist Note (Signed)
Error

## 2019-02-24 ENCOUNTER — Ambulatory Visit (INDEPENDENT_AMBULATORY_CARE_PROVIDER_SITE_OTHER): Payer: Medicaid Other | Admitting: Clinical

## 2019-02-24 ENCOUNTER — Other Ambulatory Visit: Payer: Self-pay

## 2019-02-24 DIAGNOSIS — F332 Major depressive disorder, recurrent severe without psychotic features: Secondary | ICD-10-CM

## 2019-02-24 NOTE — BH Specialist Note (Signed)
Integrated Behavioral Health via Telemedicine Video Visit  02/24/2019 Jeanne Mosley [id number]  Number of Integrated Behavioral Health visits: 1 Session Start time: 3:51 Session End time: 4:29 Total time: 30 minutes  Referring Provider: [place] Bing, MD Type of Visit: Video Patient/Family location: Home [facility] Provider location: [facility] All persons participating in visit: Patient Jeanne Mosley and [facility] Tavin Vernet  Confirmed patient's address: Yes  Confirmed patient's phone number: Yes  Any changes to demographics: No   Confirmed patient's insurance: Yes  Any changes to patient's insurance: No   Discussed confidentiality: Yes   I connected with Jeanne Mosley  by a video enabled telemedicine application and verified that I am speaking with the correct person using two identifiers.     I discussed the limitations of evaluation and management by telemedicine and the availability of in person appointments.  I discussed that the purpose of this visit is to provide behavioral health care while limiting exposure to the novel coronavirus.   Discussed there is a possibility of technology failure and discussed alternative modes of communication if that failure occurs.  I discussed that engaging in this video visit, they consent to the provision of behavioral healthcare and the services will be billed under their insurance.  Patient and/or legal guardian expressed understanding and consented to video visit: Yes   PRESENTING CONCERNS: Patient and/or family reports the following symptoms/concerns: Pt states her primary concern today is feeling depressed, lack of appetite, and feeling bad about herself, with SI 3 weeks ago; pt was referred to see a psychiatrist one year ago, but did not go and she requests another referral. Pt agrees to follow up with [facility] weekly until she is able to establish care with a psychiatrist. Pt had to hang up when another adult entered the room.   Duration of problem: Increase one month ago; Severity of problem: moderately severe  STRENGTHS (Protective Factors/Coping Skills): Open to treatment  GOALS ADDRESSED: Patient will: 1.  Reduce symptoms of: depression  2.  Demonstrate ability to: Increase adequate support systems for patient/family and Increase motivation to adhere to plan of care  INTERVENTIONS: Interventions utilized:  Behavioral Activation and Psychoeducation and/or Health Education Standardized Assessments completed: GAD-7, PHQ 9 and PHQ 2&9 with C-SSRS  ASSESSMENT: Patient currently experiencing Major depressive disorder, recurrent, severe, without psychotic features   Patient may benefit from psychoeducation and brief therapeutic interventions regarding coping with symptoms of depression .  PLAN: 1. Follow up with behavioral health clinician on : One week 2. Behavioral recommendations:  -Consider "behavioral activation" activities for the week ahead(will discuss further at next visit) -Accept referral to psychiatry -Follow safety plan, as discussed, if SI returns 3. Referral(s): Integrated [employer] (In Clinic)  I discussed the assessment and treatment plan with the patient and/or parent/guardian. They were provided an opportunity to ask questions and all were answered. They agreed with the plan and demonstrated an understanding of the instructions.   They were advised to call back or seek an in-person evaluation if the symptoms worsen or if the condition fails to improve as anticipated.  Jeanne Mosley Jeanne Mosley  Depression screen [facility] 2/9 02/24/2019 02/19/2019  Decreased Interest 1 1  Down, Depressed, Hopeless 3 1  PHQ - 2 Score 4 2  Altered sleeping 0 0  Tired, decreased energy 1 1  Change in appetite 3 2  Feeling bad or failure about yourself  3 3  Trouble concentrating 0 0  Moving slowly or fidgety/restless 0 0  Suicidal thoughts 1 1  PHQ-9 Score 12 9  Difficult doing work/chores Not  difficult at all -  ' GAD 7 : Generalized Anxiety Score 02/24/2019 02/19/2019  Nervous, Anxious, on Edge 0 1  Control/stop worrying 1 2  Worry too much - different things 1 1  Trouble relaxing 0 0  Restless 0 0  Easily annoyed or irritable 0 2  Afraid - awful might happen 1 2  Total GAD 7 Score 3 8

## 2019-02-27 ENCOUNTER — Ambulatory Visit (HOSPITAL_COMMUNITY)
Admission: RE | Admit: 2019-02-27 | Discharge: 2019-02-27 | Disposition: A | Discharge status: Home / Self Care | Payer: Managed Care, Other (non HMO) | Source: Ambulatory Visit | Attending: Obstetrics and Gynecology | Admitting: Obstetrics and Gynecology

## 2019-02-27 ENCOUNTER — Other Ambulatory Visit: Payer: Self-pay

## 2019-02-27 DIAGNOSIS — Z8759 Personal history of other complications of pregnancy, childbirth and the puerperium: Secondary | ICD-10-CM | POA: Insufficient documentation

## 2019-03-03 ENCOUNTER — Telehealth: Payer: Self-pay | Admitting: Obstetrics and Gynecology

## 2019-03-03 DIAGNOSIS — O034 Incomplete spontaneous abortion without complication: Secondary | ICD-10-CM | POA: Insufficient documentation

## 2019-03-03 NOTE — Telephone Encounter (Signed)
GYN Telephone Note  Patient called at [phone] and d/w her re: what looks like retained products of conception on her u/s. She only has spotting and no pain, fevers, chills. I told her she needs an in office d&c or in the OR and she'd like to do in office; d/w her re: po meds and need for driver.  Will try and set up for this week  Durene Romans MD Attending [facility] for [employer] [facility] (Faculty Practice) 03/03/2019 Time: [phone]

## 2019-03-04 ENCOUNTER — Telehealth: Payer: Self-pay | Admitting: Obstetrics and Gynecology
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
## 2019-03-07 ENCOUNTER — Other Ambulatory Visit (HOSPITAL_COMMUNITY): Payer: Self-pay | Admitting: Obstetrics and Gynecology

## 2019-03-07 ENCOUNTER — Other Ambulatory Visit: Payer: Self-pay | Admitting: Obstetrics and Gynecology

## 2019-03-07 DIAGNOSIS — IMO0002 Reserved for concepts with insufficient information to code with codable children: Secondary | ICD-10-CM

## 2019-03-07 NOTE — Progress Notes (Signed)
Called pt attempting to do pre op phone call, arrange PAT appt and covid testing. Pt states that she can not have the procedure on Tuesday because her son has an scheduled appointment and she needs to take him.  Gave her the office number to [facility] and asked her to call and reschedule.

## 2019-03-08 ENCOUNTER — Other Ambulatory Visit (HOSPITAL_COMMUNITY): Admission: RE | Admit: 2019-03-08 | Payer: Managed Care, Other (non HMO) | Source: Ambulatory Visit

## 2019-03-08 NOTE — Progress Notes (Signed)
Called patient to see if still coming for covid testing.  She states she is going to reschedule procedure, her son has an appt on the day.  Instructed her to call office to reschedule and canceled cobid test for today.

## 2019-03-09 ENCOUNTER — Other Ambulatory Visit: Payer: Self-pay | Admitting: Obstetrics and Gynecology

## 2019-03-10 ENCOUNTER — Encounter (HOSPITAL_COMMUNITY): Payer: Self-pay | Admitting: Anesthesiology

## 2019-03-10 ENCOUNTER — Encounter (HOSPITAL_BASED_OUTPATIENT_CLINIC_OR_DEPARTMENT_OTHER): Payer: Self-pay | Admitting: Anesthesiology

## 2019-03-10 NOTE — Progress Notes (Signed)
Spoke with receptionist at Dr Olegario Messier office [phone]) and let her know that pt is still posted for surgery. They are aware and stated that the Pt has already reached out to the office also and let them know she needs to reschedule 3 times today.

## 2019-03-11 ENCOUNTER — Ambulatory Visit (HOSPITAL_BASED_OUTPATIENT_CLINIC_OR_DEPARTMENT_OTHER): Admit: 2019-03-11 | Payer: Managed Care, Other (non HMO) | Admitting: Obstetrics and Gynecology

## 2019-03-11 ENCOUNTER — Other Ambulatory Visit: Payer: Self-pay

## 2019-03-11 ENCOUNTER — Encounter (HOSPITAL_BASED_OUTPATIENT_CLINIC_OR_DEPARTMENT_OTHER): Payer: Self-pay | Admitting: *Deleted

## 2019-03-11 ENCOUNTER — Ambulatory Visit (HOSPITAL_COMMUNITY): Payer: Managed Care, Other (non HMO)

## 2019-03-11 ENCOUNTER — Encounter (HOSPITAL_BASED_OUTPATIENT_CLINIC_OR_DEPARTMENT_OTHER): Payer: Self-pay

## 2019-03-11 SURGERY — DILATION AND EVACUATION, UTERUS
Anesthesia: Choice

## 2019-03-11 NOTE — Progress Notes (Signed)
Pre op call done, pt at work and unwilling to answer most questions. Instructed on covid testing, need for PAT appt today for EKG/BMET and quarantine instructions. Pt states that she will not be able to miss work tomorrow therefore will need to reschedule again. Encouraged her to call the office and let them know and to not go for covid test unless she is able to quarantine and come for surgery on Thursday. DM sent to Jordan at Dr. Rosana Hoes' office letting her know this.

## 2019-03-13 ENCOUNTER — Encounter: Payer: Self-pay | Admitting: Obstetrics and Gynecology

## 2019-03-13 ENCOUNTER — Ambulatory Visit (HOSPITAL_BASED_OUTPATIENT_CLINIC_OR_DEPARTMENT_OTHER): Admit: 2019-03-13 | Payer: Managed Care, Other (non HMO) | Admitting: Obstetrics and Gynecology

## 2019-03-13 ENCOUNTER — Ambulatory Visit: Payer: Managed Care, Other (non HMO) | Admitting: Obstetrics and Gynecology

## 2019-03-13 ENCOUNTER — Ambulatory Visit (HOSPITAL_COMMUNITY): Payer: Managed Care, Other (non HMO)

## 2019-03-13 SURGERY — DILATION AND EVACUATION, UTERUS
Anesthesia: Choice

## 2019-03-13 NOTE — Progress Notes (Signed)
Patient called and d/w her re: appointment today which was scheduled weeks ago in anticipation of her getting her uterus evacuated in the interim and placing a paragard IUD.    Patient had to cancel initial surgery b/c she states it was scheduled w/o regard to her schedule and she had to cancel today's surgery b/c she can't quarantine.  I told her that unfortunately our office is booked up and can't do any in office manual vacuum aspiration procedures and we've already lost two OR slots.  Given this, I told her I recommend that she call the abortion clinic that prescribed the medications initially and/or call her family doctor and ask about a GYN referral. Before I had a chance to let her know that we will continue to provide care for acute needs for the next month, she hung up the phone. Will have our clinic send a letter to that effect  Durene Romans MD Attending Center for [employer] (Faculty Practice) 03/13/2019 Time: [id number]

## 2019-04-15 ENCOUNTER — Encounter: Payer: Self-pay | Admitting: Obstetrics and Gynecology

## 2019-04-15 ENCOUNTER — Other Ambulatory Visit: Payer: Self-pay

## 2019-04-15 ENCOUNTER — Encounter: Payer: Self-pay | Admitting: General Practice

## 2019-04-15 ENCOUNTER — Ambulatory Visit (INDEPENDENT_AMBULATORY_CARE_PROVIDER_SITE_OTHER): Payer: Managed Care, Other (non HMO) | Admitting: Obstetrics and Gynecology

## 2019-04-15 DIAGNOSIS — Z30430 Encounter for insertion of intrauterine contraceptive device: Secondary | ICD-10-CM | POA: Diagnosis not present

## 2019-04-15 LAB — POCT PREGNANCY, URINE: Preg Test, Ur: NEGATIVE

## 2019-04-15 MED ORDER — PARAGARD INTRAUTERINE COPPER IU IUD
INTRAUTERINE_SYSTEM | Freq: Once | INTRAUTERINE | Status: AC
  Administered 2019-04-15: 09:00:00 via INTRAUTERINE

## 2019-04-15 NOTE — Progress Notes (Signed)
    GYNECOLOGY CLINIC PROCEDURE NOTE  Jeanne Mosley is a 33 y.o. [id number] here for ParaGard IUD insertion. [facility] and [facility]. Was told everything was good. LMP 03/29/19. Lasted 7 days UPT today negative Unofficial TVUS in office today endometrial strip thin and normal appearing.  IUD Insertion Procedure Note Patient identified, informed consent performed, consent signed.   Discussed risks of irregular bleeding, cramping, infection, malpositioning or misplacement of the IUD outside the uterus which may require further procedure such as laparoscopy. Time out was performed.  Urine pregnancy test negative.  Speculum placed in the vagina.  Cervix visualized.  Cleaned with Betadine x 2.  Grasped anteriorly with a single tooth tenaculum.  Uterus sounded to 9 cm.  ParaGard IUD placed per manufacturer's recommendations.  Strings trimmed to 3 cm. Tenaculum was removed, good hemostasis noted.  Patient tolerated procedure well.   Patient was given post-procedure instructions.  She was advised to have backup contraception for one week.  Patient was also asked to check IUD strings periodically and follow up in 4-6 weeks for IUD check.    Arlina Robes, MD, [place] Attending [place] for [place]

## 2019-04-15 NOTE — Patient Instructions (Signed)

## 2019-05-05 ENCOUNTER — Encounter: Payer: Self-pay | Admitting: *Deleted

## 2019-05-22 ENCOUNTER — Ambulatory Visit: Payer: Managed Care, Other (non HMO) | Admitting: Obstetrics and Gynecology

## 2019-05-26 ENCOUNTER — Emergency Department
Admission: EM | Admit: 2019-05-26 | Discharge: 2019-05-27 | Disposition: A | Discharge status: Home / Self Care | Payer: Managed Care, Other (non HMO) | Attending: Student | Admitting: Student

## 2019-05-26 ENCOUNTER — Other Ambulatory Visit: Payer: Self-pay

## 2019-05-26 ENCOUNTER — Encounter: Payer: Self-pay | Admitting: Emergency Medicine

## 2019-05-26 DIAGNOSIS — R1010 Upper abdominal pain, unspecified: Secondary | ICD-10-CM

## 2019-05-26 DIAGNOSIS — Z79899 Other long term (current) drug therapy: Secondary | ICD-10-CM | POA: Insufficient documentation

## 2019-05-26 DIAGNOSIS — R2241 Localized swelling, mass and lump, right lower limb: Secondary | ICD-10-CM | POA: Diagnosis not present

## 2019-05-26 DIAGNOSIS — Z794 Long term (current) use of insulin: Secondary | ICD-10-CM | POA: Insufficient documentation

## 2019-05-26 DIAGNOSIS — E109 Type 1 diabetes mellitus without complications: Secondary | ICD-10-CM | POA: Insufficient documentation

## 2019-05-26 LAB — LIPASE, BLOOD: Lipase: 28 U/L (ref 11–51)

## 2019-05-26 LAB — CBC WITH DIFFERENTIAL/PLATELET
Abs Immature Granulocytes: 0.02 10*3/uL (ref 0.00–0.07)
Basophils Absolute: 0 10*3/uL (ref 0.0–0.1)
Basophils Relative: 0 %
Eosinophils Absolute: 0.2 10*3/uL (ref 0.0–0.5)
Eosinophils Relative: 2 %
HCT: 36.3 % (ref 36.0–46.0)
Hemoglobin: 11.8 g/dL — ABNORMAL LOW (ref 12.0–15.0)
Immature Granulocytes: 0 %
Lymphocytes Relative: 26 %
Lymphs Abs: 2 10*3/uL (ref 0.7–4.0)
MCH: 28.5 pg (ref 26.0–34.0)
MCHC: 32.5 g/dL (ref 30.0–36.0)
MCV: 87.7 fL (ref 80.0–100.0)
Monocytes Absolute: 0.6 10*3/uL (ref 0.1–1.0)
Monocytes Relative: 8 %
Neutro Abs: 4.7 10*3/uL (ref 1.7–7.7)
Neutrophils Relative %: 64 %
Platelets: 166 10*3/uL (ref 150–400)
RBC: 4.14 MIL/uL (ref 3.87–5.11)
RDW: 13.5 % (ref 11.5–15.5)
WBC: 7.4 10*3/uL (ref 4.0–10.5)
nRBC: 0 % (ref 0.0–0.2)

## 2019-05-26 LAB — POCT PREGNANCY, URINE: Preg Test, Ur: NEGATIVE

## 2019-05-26 LAB — URINALYSIS, COMPLETE (UACMP) WITH MICROSCOPIC
Bacteria, UA: NONE SEEN
Bilirubin Urine: NEGATIVE
Glucose, UA: >=500 mg/dL — AB
Ketones, ur: 5 mg/dL — AB
Leukocytes,Ua: NEGATIVE
Nitrite: NEGATIVE
Protein, ur: NEGATIVE mg/dL
RBC / HPF: >50 RBC/hpf — ABNORMAL HIGH (ref 0–5)
Specific Gravity, Urine: 1.021 (ref 1.005–1.030)
pH: 5 (ref 5.0–8.0)

## 2019-05-26 LAB — COMPREHENSIVE METABOLIC PANEL
ALT: 16 U/L (ref 0–44)
AST: 21 U/L (ref 15–41)
Albumin: 3.8 g/dL (ref 3.5–5.0)
Alkaline Phosphatase: 58 U/L (ref 38–126)
Anion gap: 9 (ref 5–15)
BUN: 9 mg/dL (ref 6–20)
CO2: 27 mmol/L (ref 22–32)
Calcium: 8.8 mg/dL — ABNORMAL LOW (ref 8.9–10.3)
Chloride: 99 mmol/L (ref 98–111)
Creatinine, Ser: 0.47 mg/dL (ref 0.44–1.00)
GFR calc Af Amer: >60 mL/min (ref >60)
GFR calc non Af Amer: >60 mL/min (ref >60)
Glucose, Bld: 250 mg/dL — ABNORMAL HIGH (ref 70–99)
Potassium: 3.8 mmol/L (ref 3.5–5.1)
Sodium: 135 mmol/L (ref 135–145)
Total Bilirubin: 0.7 mg/dL (ref 0.3–1.2)
Total Protein: 7.2 g/dL (ref 6.5–8.1)

## 2019-05-26 LAB — TROPONIN I (HIGH SENSITIVITY): Troponin I (High Sensitivity): <2 ng/L (ref <18)

## 2019-05-26 NOTE — ED Triage Notes (Signed)
Patient ambulatory to triage with steady gait, without difficulty or distress noted, mask in place; pt reports since yesterday having upper abd pain radiating into back; denies any accomp symptoms; denies hx of same

## 2019-05-26 NOTE — ED Notes (Signed)
Call lab to request phlebotomist

## 2019-05-27 ENCOUNTER — Emergency Department: Payer: Managed Care, Other (non HMO)

## 2019-05-27 LAB — FIBRIN DERIVATIVES D-DIMER (ARMC ONLY): Fibrin derivatives D-dimer (ARMC): 439.64 ng/mL (FEU) (ref 0.00–499.00)

## 2019-05-27 MED ORDER — FAMOTIDINE 20 MG PO TABS
20.0000 mg | ORAL_TABLET | Freq: Once | ORAL | Status: AC
  Administered 2019-05-27: 02:00:00 20 mg via ORAL
  Filled 2019-05-27: qty 1

## 2019-05-27 MED ORDER — ALUM & MAG HYDROXIDE-SIMETH 200-200-20 MG/5ML PO SUSP
30.0000 mL | Freq: Once | ORAL | Status: AC
  Administered 2019-05-27: 30 mL via ORAL
  Filled 2019-05-27: qty 30

## 2019-05-27 MED ORDER — FAMOTIDINE 20 MG PO TABS: 20.0000 mg | ORAL_TABLET | Freq: Two times a day (BID) | ORAL | 0 refills | Status: DC

## 2019-05-27 NOTE — ED Notes (Signed)
Ultrasound done at bedside. Pt has no needs at this time

## 2019-05-27 NOTE — ED Provider Notes (Signed)
[facility] Emergency Department Provider Note  ____________________________________________   First MD Initiated Contact with Patient 05/26/19 2330     (approximate)  I have reviewed the triage vital signs and the nursing notes.  History  Chief Complaint Abdominal Pain    HPI Jeanne Mosley is a 33 y.o. female with a history of DM, type I, MS who presents to the emergency department for upper abdominal pain, back pain.  She also complains of leg swelling.  Patient states she first developed upper abdominal pain earlier today.  Started in her left upper quadrant area and then seemed to migrate to the epigastric and then the right upper quadrant area. Now the pain is localized all across her upper abdomen. Her upper abdominal pain seems to radiate to her right shoulder area.  Pain is sharp, 7/10 in severity.  Constant.  No alleviating/aggravating factors.  Associated with nausea, but no vomiting, no diarrhea.  No dysuria.  Currently on her menses. No hx of nephrolithiasis.   Aside, she also reports some mild right-sided leg swelling, which she noticed yesterday.  No associated pain.  She denies any history of the same.  She is not on any hormonal medications, no recent travel, no history of VTE.  No chest pain or difficulty breathing.   Past Medical Hx Past Medical History:  Diagnosis Date  . Cervical dysplasia   . Depression   . DM (diabetes mellitus), type 1 ([place])   . MS (multiple sclerosis) ([place])   . UTI (urinary tract infection)     Problem List Patient Active Problem List   Diagnosis Date Noted  . Encounter for insertion of copper IUD 04/15/2019  . Depression 02/19/2019  . Pain due to onychomycosis of toenails of both feet 02/05/2019  . Diabetes mellitus type I ([place]) 02/05/2019  . Seasonal allergic rhinitis 01/03/2019  . Proliferative diabetic retinopathy ([place]) 09/30/2018  . Human papillomavirus in conditions classified elsewhere and of  unspecified site 09/30/2018  . Atypical squamous cell changes of cervix undetermined significance favor benign 09/30/2018  . Chronic constipation 08/14/2018  . DKA (diabetic ketoacidoses) ([place]) 01/15/2017  . Multiple sclerosis ([place]) 11/24/2016  . Leukocytosis 11/24/2016  . Severe episode of recurrent major depressive disorder, without psychotic features ([place]) 11/24/2016  . Uncontrolled type 1 diabetes mellitus with diabetic retinopathy ([place]) 11/24/2016    Past Surgical Hx Past Surgical History:  Procedure Laterality Date  . CERVIX LESION DESTRUCTION    . CESAREAN SECTION    . EYE SURGERY     x4    Medications Prior to Admission medications   Medication Sig Start Date End Date Taking? Authorizing Provider  Dimethyl Fumarate 240 MG CPDR Take by mouth.    [provider]  HUMALOG 100 UNIT/ML injection  01/24/19   [provider]  insulin aspart (NOVOLOG) 100 UNIT/ML injection Glucose 121-150 use 2 units, 151-200 use 4 units, 201-250 use 6 units, 251 to 300 use 8 units, 301 to 350 use 10 units, 351 or grater use 12 units. Patient taking differently: Insulin pump 01/16/17 04/15/19  Arrien, Jimmy Picket, MD  insulin glargine (LANTUS) 100 UNIT/ML injection Inject 0.22 mLs (22 Units total) into the skin at bedtime. 01/16/17 02/15/17  Arrien, Jimmy Picket, MD  Needles & Syringes MISC Please give insulin syringes and needles. 01/16/17   Arrien, Jimmy Picket, MD    Allergies Sulfamethoxazole-trimethoprim, Aspartame, Sulfa antibiotics, and Sweetness enhancer  Family Hx No family history on file.  Social Hx Social History  Tobacco Use  . Smoking status: Never Smoker  . Smokeless tobacco: Never Used  Substance Use Topics  . Alcohol use: Yes  . Drug use: No     Review of Systems  Constitutional: Negative for fever, chills. Eyes: Negative for visual changes. ENT: Negative for sore throat. Cardiovascular: Negative for chest pain. Respiratory: Negative for  shortness of breath. Gastrointestinal: + abdominal pain Genitourinary: Negative for dysuria. Musculoskeletal: + for leg swelling. Skin: Negative for rash. Neurological: Negative for for headaches.   Physical Exam  Vital Signs: ED Triage Vitals [05/26/19 2136]  Enc Vitals Group     BP 127/79     Pulse Rate 95     Resp 18     Temp 98.2 F (36.8 C)     Temp Source Oral     SpO2 100 %     Weight 146 lb (66.2 kg)     Height 5\' 2"  (1.575 m)     Head Circumference      Peak Flow      Pain Score 7     Pain Loc      Pain Edu?      Excl. in GC?     Constitutional: Alert and oriented.  Head: Normocephalic. Atraumatic. Eyes: Conjunctivae clear. Sclera anicteric. Nose: No congestion. No rhinorrhea. Mouth/Throat: Wearing mask.  Neck: No stridor.   Cardiovascular: Normal rate, regular rhythm. Extremities well perfused. Respiratory: Normal respiratory effort.  Lungs CTAB. Gastrointestinal: Soft. TTP in the RUQ. Non-distended.  Musculoskeletal: Trace edema of the RLE compared to the left, located to the lower leg area. Neurologic:  Normal speech and language. No gross focal neurologic deficits are appreciated.  Skin: Skin is warm, dry and intact. No rash noted. Psychiatric: Mood and affect are appropriate for situation.  EKG  Personally reviewed.   Rate: 89 Rhythm: sinus Axis: normal Intervals: WNL No acute ischemic changes No STEMI    Radiology  RLE: IMPRESSION:  No evidence of deep venous thrombosis.  Probable popliteal cyst.   [place] RUQ: IMPRESSION:  Partially contracted gallbladder. Otherwise normal right upper  quadrant ultrasound.   CXR: IMPRESSION:  No acute cardiopulmonary abnormality.    Procedures  Procedure(s) performed (including critical care):  Procedures   Initial Impression / Assessment and Plan / ED Course  33 y.o. female who presents to the ED for upper abdominal pain, radiating to the back. Also complains of RLE swelling.  Ddx:  pancreatitis, gastritis, biliary colic, lower lobe PNA.  Ddx: consider DVT of her RLE given complaint of leg swelling.   Will obtain labs, imaging.  CXR negative.  Abdominal ultrasound negative.  Ultrasound of the right lower extremity is negative for DVT, probable popliteal cyst, which could be the etiology of her swelling.  D-dimer is negative.  Given combination of negative ultrasound and D-dimer, PE very unlikely.  Remainder of labs without actionable derangements.  Patient feels improved after famotidine, GI cocktail.  She states she has been previously prescribed this before with improvement in her symptoms.  We will plan for discharge with Rx for famotidine, advised PCP follow-up, GERD diet.  Patient comfortable with the plan and discharge.  Given return precautions.   Final Clinical Impression(s) / ED Diagnosis  Final diagnoses:  Upper abdominal pain       Note:  This document was prepared using Dragon voice recognition software and may include unintentional dictation errors.   38., MD 05/27/19 [phone]

## 2019-05-27 NOTE — Discharge Instructions (Addendum)
Thank you for letting us take care of you in the emergency department today.   Please continue to take any regular, prescribed medications.   New medications we have prescribed:  - Famotidine - take as directed  Please follow up with: - Your primary care doctor to review your ER visit and follow up on your symptoms.   Please return to the ER for any new or worsening symptoms.

## 2019-06-18 ENCOUNTER — Other Ambulatory Visit: Payer: Self-pay

## 2019-06-18 ENCOUNTER — Encounter: Payer: Self-pay | Admitting: Obstetrics and Gynecology

## 2019-06-18 ENCOUNTER — Ambulatory Visit (INDEPENDENT_AMBULATORY_CARE_PROVIDER_SITE_OTHER): Payer: Managed Care, Other (non HMO) | Admitting: Obstetrics and Gynecology

## 2019-06-18 DIAGNOSIS — Z30431 Encounter for routine checking of intrauterine contraceptive device: Secondary | ICD-10-CM | POA: Diagnosis not present

## 2019-06-18 NOTE — Progress Notes (Signed)
Pt states can't feel the string.

## 2019-06-18 NOTE — Progress Notes (Signed)
Patient ID: Jeanne Mosley, female   DOB: Dec 27, 1985, 34 y.o.   MRN: [id number] Jeanne Mosley is here for IUD check. ParaGard placed last month No complaints except she can't feel the string.  PE AF VSS Lungs clear Heart RRR Abd soft + BS GU Nl EGBUS IUD string noted  A/P IUD check up Pt reassured that IUD string is visualized on exam. F/U PRN

## 2019-06-18 NOTE — Patient Instructions (Signed)
Health Maintenance, Female Adopting a healthy lifestyle and getting preventive care are important in promoting health and wellness. Ask your health care provider about:  The right schedule for you to have regular tests and exams.  Things you can do on your own to prevent diseases and keep yourself healthy. What should I know about diet, weight, and exercise? Eat a healthy diet   Eat a diet that includes plenty of vegetables, fruits, low-fat dairy products, and lean protein.  Do not eat a lot of foods that are high in solid fats, added sugars, or sodium. Maintain a healthy weight Body mass index (BMI) is used to identify weight problems. It estimates body fat based on height and weight. Your health care provider can help determine your BMI and help you achieve or maintain a healthy weight. Get regular exercise Get regular exercise. This is one of the most important things you can do for your health. Most adults should:  Exercise for at least 150 minutes each week. The exercise should increase your heart rate and make you sweat (moderate-intensity exercise).  Do strengthening exercises at least twice a week. This is in addition to the moderate-intensity exercise.  Spend less time sitting. Even light physical activity can be beneficial. Watch cholesterol and blood lipids Have your blood tested for lipids and cholesterol at 32 years of age, then have this test every 5 years. Have your cholesterol levels checked more often if:  Your lipid or cholesterol levels are high.  You are older than 32 years of age.  You are at high risk for heart disease. What should I know about cancer screening? Depending on your health history and family history, you may need to have cancer screening at various ages. This may include screening for:  Breast cancer.  Cervical cancer.  Colorectal cancer.  Skin cancer.  Lung cancer. What should I know about heart disease, diabetes, and high blood  pressure? Blood pressure and heart disease  High blood pressure causes heart disease and increases the risk of stroke. This is more likely to develop in people who have high blood pressure readings, are of African descent, or are overweight.  Have your blood pressure checked: ? Every 3-5 years if you are 18-39 years of age. ? Every year if you are 40 years old or older. Diabetes Have regular diabetes screenings. This checks your fasting blood sugar level. Have the screening done:  Once every three years after age 40 if you are at a normal weight and have a low risk for diabetes.  More often and at a younger age if you are overweight or have a high risk for diabetes. What should I know about preventing infection? Hepatitis B If you have a higher risk for hepatitis B, you should be screened for this virus. Talk with your health care provider to find out if you are at risk for hepatitis B infection. Hepatitis C Testing is recommended for:  Everyone born from 1945 through 1965.  Anyone with known risk factors for hepatitis C. Sexually transmitted infections (STIs)  Get screened for STIs, including gonorrhea and chlamydia, if: ? You are sexually active and are younger than 32 years of age. ? You are older than 32 years of age and your health care provider tells you that you are at risk for this type of infection. ? Your sexual activity has changed since you were last screened, and you are at increased risk for chlamydia or gonorrhea. Ask your health care provider if   you are at risk.  Ask your health care provider about whether you are at high risk for HIV. Your health care provider may recommend a prescription medicine to help prevent HIV infection. If you choose to take medicine to prevent HIV, you should first get tested for HIV. You should then be tested every 3 months for as long as you are taking the medicine. Pregnancy  If you are about to stop having your period (premenopausal) and  you may become pregnant, seek counseling before you get pregnant.  Take 400 to 800 micrograms (mcg) of folic acid every day if you become pregnant.  Ask for birth control (contraception) if you want to prevent pregnancy. Osteoporosis and menopause Osteoporosis is a disease in which the bones lose minerals and strength with aging. This can result in bone fractures. If you are 65 years old or older, or if you are at risk for osteoporosis and fractures, ask your health care provider if you should:  Be screened for bone loss.  Take a calcium or vitamin D supplement to lower your risk of fractures.  Be given hormone replacement therapy (HRT) to treat symptoms of menopause. Follow these instructions at home: Lifestyle  Do not use any products that contain nicotine or tobacco, such as cigarettes, e-cigarettes, and chewing tobacco. If you need help quitting, ask your health care provider.  Do not use street drugs.  Do not share needles.  Ask your health care provider for help if you need support or information about quitting drugs. Alcohol use  Do not drink alcohol if: ? Your health care provider tells you not to drink. ? You are pregnant, may be pregnant, or are planning to become pregnant.  If you drink alcohol: ? Limit how much you use to 0-1 drink a day. ? Limit intake if you are breastfeeding.  Be aware of how much alcohol is in your drink. In the [place] one drink equals one 12 oz bottle of beer (355 mL), one 5 oz glass of wine (148 mL), or one 1 oz glass of hard liquor (44 mL). General instructions  Schedule regular health, dental, and eye exams.  Stay current with your vaccines.  Tell your health care provider if: ? You often feel depressed. ? You have ever been abused or do not feel safe at home. Summary  Adopting a healthy lifestyle and getting preventive care are important in promoting health and wellness.  Follow your health care provider's instructions about healthy  diet, exercising, and getting tested or screened for diseases.  Follow your health care provider's instructions on monitoring your cholesterol and blood pressure. This information is not intended to replace advice given to you by your health care provider. Make sure you discuss any questions you have with your health care provider. Document Revised: 05/15/2018 Document Reviewed: 05/15/2018 Elsevier Patient Education  2020 [employer]  

## 2019-07-13 ENCOUNTER — Emergency Department
Admission: EM | Admit: 2019-07-13 | Discharge: 2019-07-13 | Disposition: A | Discharge status: Home / Self Care | Payer: Managed Care, Other (non HMO) | Attending: Emergency Medicine | Admitting: Emergency Medicine

## 2019-07-13 ENCOUNTER — Other Ambulatory Visit: Payer: Self-pay

## 2019-07-13 DIAGNOSIS — Z79899 Other long term (current) drug therapy: Secondary | ICD-10-CM | POA: Diagnosis not present

## 2019-07-13 DIAGNOSIS — Z9641 Presence of insulin pump (external) (internal): Secondary | ICD-10-CM | POA: Insufficient documentation

## 2019-07-13 DIAGNOSIS — Z794 Long term (current) use of insulin: Secondary | ICD-10-CM | POA: Insufficient documentation

## 2019-07-13 DIAGNOSIS — E1069 Type 1 diabetes mellitus with other specified complication: Secondary | ICD-10-CM

## 2019-07-13 DIAGNOSIS — R739 Hyperglycemia, unspecified: Secondary | ICD-10-CM

## 2019-07-13 DIAGNOSIS — R110 Nausea: Secondary | ICD-10-CM | POA: Diagnosis not present

## 2019-07-13 DIAGNOSIS — E1065 Type 1 diabetes mellitus with hyperglycemia: Secondary | ICD-10-CM | POA: Insufficient documentation

## 2019-07-13 LAB — URINALYSIS, COMPLETE (UACMP) WITH MICROSCOPIC
Bacteria, UA: NONE SEEN
Bilirubin Urine: NEGATIVE
Glucose, UA: >=500 mg/dL — AB
Hgb urine dipstick: NEGATIVE
Ketones, ur: 20 mg/dL — AB
Nitrite: NEGATIVE
Protein, ur: NEGATIVE mg/dL
Specific Gravity, Urine: 1.024 (ref 1.005–1.030)
pH: 6 (ref 5.0–8.0)

## 2019-07-13 LAB — GLUCOSE, CAPILLARY
Glucose-Capillary: 428 mg/dL — ABNORMAL HIGH (ref 70–99)
Glucose-Capillary: 349 mg/dL — ABNORMAL HIGH (ref 70–99)
Glucose-Capillary: 287 mg/dL — ABNORMAL HIGH (ref 70–99)

## 2019-07-13 LAB — COMPREHENSIVE METABOLIC PANEL
ALT: 21 U/L (ref 0–44)
AST: 25 U/L (ref 15–41)
Albumin: 4 g/dL (ref 3.5–5.0)
Alkaline Phosphatase: 69 U/L (ref 38–126)
Anion gap: 13 (ref 5–15)
BUN: 16 mg/dL (ref 6–20)
CO2: 21 mmol/L — ABNORMAL LOW (ref 22–32)
Calcium: 9.1 mg/dL (ref 8.9–10.3)
Chloride: 100 mmol/L (ref 98–111)
Creatinine, Ser: 0.62 mg/dL (ref 0.44–1.00)
GFR calc Af Amer: >60 mL/min (ref >60)
GFR calc non Af Amer: >60 mL/min (ref >60)
Glucose, Bld: 409 mg/dL — ABNORMAL HIGH (ref 70–99)
Potassium: 5.1 mmol/L (ref 3.5–5.1)
Sodium: 134 mmol/L — ABNORMAL LOW (ref 135–145)
Total Bilirubin: 0.9 mg/dL (ref 0.3–1.2)
Total Protein: 7.8 g/dL (ref 6.5–8.1)

## 2019-07-13 LAB — CBC WITH DIFFERENTIAL/PLATELET
Abs Immature Granulocytes: 0.04 10*3/uL (ref 0.00–0.07)
Basophils Absolute: 0 10*3/uL (ref 0.0–0.1)
Basophils Relative: 0 %
Eosinophils Absolute: 0.2 10*3/uL (ref 0.0–0.5)
Eosinophils Relative: 2 %
HCT: 37.9 % (ref 36.0–46.0)
Hemoglobin: 12.4 g/dL (ref 12.0–15.0)
Immature Granulocytes: 0 %
Lymphocytes Relative: 12 %
Lymphs Abs: 1.2 10*3/uL (ref 0.7–4.0)
MCH: 28.6 pg (ref 26.0–34.0)
MCHC: 32.7 g/dL (ref 30.0–36.0)
MCV: 87.3 fL (ref 80.0–100.0)
Monocytes Absolute: 0.5 10*3/uL (ref 0.1–1.0)
Monocytes Relative: 5 %
Neutro Abs: 8.2 10*3/uL — ABNORMAL HIGH (ref 1.7–7.7)
Neutrophils Relative %: 81 %
Platelets: 340 10*3/uL (ref 150–400)
RBC: 4.34 MIL/uL (ref 3.87–5.11)
RDW: 13.3 % (ref 11.5–15.5)
WBC: 10.2 10*3/uL (ref 4.0–10.5)
nRBC: 0 % (ref 0.0–0.2)

## 2019-07-13 LAB — PREGNANCY, URINE: Preg Test, Ur: NEGATIVE

## 2019-07-13 MED ORDER — ONDANSETRON HCL 4 MG/2ML IJ SOLN
4.0000 mg | Freq: Once | INTRAMUSCULAR | Status: AC
  Administered 2019-07-13: 4 mg via INTRAVENOUS
  Filled 2019-07-13: qty 2

## 2019-07-13 MED ORDER — INSULIN ASPART 100 UNIT/ML ~~LOC~~ SOLN
6.0000 [IU] | Freq: Once | SUBCUTANEOUS | Status: AC
  Administered 2019-07-13: 6 [IU] via SUBCUTANEOUS
  Filled 2019-07-13: qty 1

## 2019-07-13 MED ORDER — LACTATED RINGERS IV BOLUS
1000.0000 mL | Freq: Once | INTRAVENOUS | Status: AC
  Administered 2019-07-13: 1000 mL via INTRAVENOUS

## 2019-07-13 MED ORDER — INSULIN ASPART 100 UNIT/ML ~~LOC~~ SOLN: SUBCUTANEOUS | 0 refills | Status: AC

## 2019-07-13 MED ORDER — SODIUM CHLORIDE 0.9 % IV BOLUS
1000.0000 mL | Freq: Once | INTRAVENOUS | Status: AC
  Administered 2019-07-13: 1000 mL via INTRAVENOUS

## 2019-07-13 NOTE — ED Triage Notes (Signed)
Patient reports her insulin pump is reading high.  Reports she changed her insulin earlier and noticed that it had a cloudy color.

## 2019-07-13 NOTE — ED Notes (Signed)
Pt verbalized understanding of discharge instructions. NAD at this time. 

## 2019-07-13 NOTE — ED Provider Notes (Signed)
[facility] Emergency Department Provider Note  ____________________________________________   First MD Initiated Contact with Patient 07/13/19 0700     (approximate)  I have reviewed the triage vital signs and the nursing notes.   HISTORY  Chief Complaint Hyperglycemia    HPI Jeanne Mosley is a 34 y.o. female with type 1 diabetes, a ms who comes in with high sugars.  Patient states that her sugars been running high the last 2 days.  She states that she noticed that her insulin looks little bit more cloudy than normal.  Denies having this previously.  States she first noticed it 2 days ago.  She does have a little bit of nausea that started since arriving, constant, nothing makes better, nothing makes it worse and feels like her heart is racing.  She states that she was trying to get a new refill of her insulin but there were some issues at the [facility] so she want to come here before going into DKA.  She denies any abdominal pain.  Had a recent positive Covid contact but denies being positive when she was tested a few days ago and denies any Covid symptoms.  Denies any urinary symptoms.  Denies any chest pain or shortness of breath.  Diagnosed at 22 months.  She recently switched a couple weeks ago from the Omnipod insulin pump to the T-slim x2 insulin pump with Dexcom CGM. She also has only had DKA twice and last time was years ago.           Past Medical History:  Diagnosis Date  . Cervical dysplasia   . Depression   . DM (diabetes mellitus), type 1 (HCC)   . MS (multiple sclerosis) (HCC)   . UTI (urinary tract infection)     Patient Active Problem List   Diagnosis Date Noted  . IUD check up 06/18/2019  . Encounter for insertion of copper IUD 04/15/2019  . Depression 02/19/2019  . Pain due to onychomycosis of toenails of both feet 02/05/2019  . Diabetes mellitus type I (HCC) 02/05/2019  . Seasonal allergic rhinitis 01/03/2019  .  Proliferative diabetic retinopathy (HCC) 09/30/2018  . Human papillomavirus in conditions classified elsewhere and of unspecified site 09/30/2018  . Atypical squamous cell changes of cervix undetermined significance favor benign 09/30/2018  . Chronic constipation 08/14/2018  . DKA (diabetic ketoacidoses) (HCC) 01/15/2017  . Multiple sclerosis (HCC) 11/24/2016  . Leukocytosis 11/24/2016  . 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 Uncontrolled type 1 diabetes mellitus with diabetic retinopathy (HCC) 11/24/2016    Past Surgical History:  Procedure Laterality Date  . CERVIX LESION DESTRUCTION    . CESAREAN SECTION    . EYE SURGERY     x4    Prior to Admission medications   Medication Sig Start Date End Date Taking? Authorizing Provider  Dimethyl Fumarate 240 MG CPDR Take by mouth.    [provider]  famotidine (PEPCID) 20 MG tablet Take 1 tablet (20 mg total) by mouth 2 (two) times daily. 05/27/19 06/26/19  Miguel Aschoff., MD  HUMALOG 100 UNIT/ML injection  01/24/19   [provider]  insulin aspart (NOVOLOG) 100 UNIT/ML injection Glucose 121-150 use 2 units, 151-200 use 4 units, 201-250 use 6 units, 251 to 300 use 8 units, 301 to 350 use 10 units, 351 or grater use 12 units. Patient taking differently: Insulin pump 01/16/17 04/15/19  Arrien, York Ram, MD  insulin glargine (LANTUS) 100  UNIT/ML injection Inject 0.22 mLs (22 Units total) into the skin at bedtime. 01/16/17 02/15/17  Arrien, York Ram, MD  Needles & Syringes MISC Please give insulin syringes and needles. 01/16/17   Arrien, York Ram, MD    Allergies Sulfamethoxazole-trimethoprim, Aspartame, Sulfa antibiotics, and Sweetness enhancer  No family history on file.  Social History Social History   Tobacco Use  . Smoking status: Never Smoker  . Smokeless tobacco: Never Used  Substance Use Topics  . Alcohol use: Yes  . Drug use:  No      Review of Systems Constitutional: No fever/chills, high sugars Eyes: No visual changes. ENT: No sore throat. Cardiovascular: Denies chest pain. Respiratory: Denies shortness of breath. Gastrointestinal: No abdominal pain.  Positive nausea no vomiting.  No diarrhea.  No constipation. Genitourinary: Negative for dysuria. Musculoskeletal: Negative for back pain. Skin: Negative for rash. Neurological: Negative for headaches, focal weakness or numbness. All other ROS negative ____________________________________________   PHYSICAL EXAM:  VITAL SIGNS: ED Triage Vitals  Enc Vitals Group     BP 07/13/19 0614 118/73     Pulse Rate 07/13/19 0614 (!) 101     Resp 07/13/19 0614 18     Temp 07/13/19 0614 98.3 F (36.8 C)     Temp Source 07/13/19 0614 Oral     SpO2 07/13/19 0614 100 %     Weight 07/13/19 0611 152 lb (68.9 kg)     Height 07/13/19 0611 5\' 2"  (1.575 m)     Head Circumference --      Peak Flow --      Pain Score 07/13/19 0611 0     Pain Loc --      Pain Edu? --      Excl. in GC? --     Constitutional: Alert and oriented. Well appearing and in no acute distress. Eyes: Conjunctivae are normal. EOMI. Head: Atraumatic. Nose: No congestion/rhinnorhea. Mouth/Throat: Mucous membranes are dry Neck: No stridor. Trachea Midline. FROM Cardiovascular: Tachycardic, regular rhythm. Grossly normal heart sounds.  Good peripheral circulation. Respiratory: Normal respiratory effort.  No retractions. Lungs CTAB. Gastrointestinal: Soft and nontender. No distention. No abdominal bruits.  Insulin pump in the right lower quadrant with no erythema over it Musculoskeletal: No lower extremity tenderness nor edema.  No joint effusions. Neurologic:  Normal speech and language. No gross focal neurologic deficits are appreciated.  Skin:  Skin is warm, dry and intact. No rash noted. Psychiatric: Mood and affect are normal. Speech and behavior are normal. GU: Deferred    ____________________________________________   LABS (all labs ordered are listed, but only abnormal results are displayed)  Labs Reviewed  URINALYSIS, COMPLETE (UACMP) WITH MICROSCOPIC - Abnormal; Notable for the following components:      Result Value   Color, Urine STRAW (*)    APPearance CLEAR (*)    Glucose, UA >=500 (*)    Ketones, ur 20 (*)    Leukocytes,Ua MODERATE (*)    All other components within normal limits  CBC WITH DIFFERENTIAL/PLATELET - Abnormal; Notable for the following components:   Neutro Abs 8.2 (*)    All other components within normal limits  COMPREHENSIVE METABOLIC PANEL - Abnormal; Notable for the following components:   Sodium 134 (*)    CO2 21 (*)    Glucose, Bld 409 (*)    All other components within normal limits  GLUCOSE, CAPILLARY - Abnormal; Notable for the following components:   Glucose-Capillary 428 (*)    All other components within normal limits  GLUCOSE, CAPILLARY - Abnormal; Notable for the following components:   Glucose-Capillary 349 (*)    All other components within normal limits  GLUCOSE, CAPILLARY - Abnormal; Notable for the following components:   Glucose-Capillary 287 (*)    All other components within normal limits  URINE CULTURE  PREGNANCY, URINE  POC URINE PREG, ED   ____________________________________________    PROCEDURES  Procedure(s) performed (including Critical Care):  Ultrasound ED Peripheral IV (Provider)  Date/Time: 07/13/2019 7:24 AM Performed by: Vanessa Truxton, MD Authorized by: Vanessa [place], MD   Procedure details:    Indications: multiple failed IV attempts     Skin Prep: chlorhexidine gluconate     Location:  Left anterior forearm   Angiocath:  20 G   Bedside Ultrasound Guided: Yes     Images: not archived     Patient tolerated procedure without complications: Yes     Dressing applied: Yes       ____________________________________________   INITIAL IMPRESSION / ASSESSMENT AND PLAN /  ED COURSE  Jeanne Mosley was evaluated in Emergency Department on 07/13/2019 for the symptoms described in the history of present illness. She was evaluated in the context of the global COVID-19 pandemic, which necessitated consideration that the patient might be at risk for infection with the SARS-CoV-2 virus that causes COVID-19. Institutional protocols and algorithms that pertain to the evaluation of patients at risk for COVID-19 are in a state of rapid change based on information released by regulatory bodies including the CDC and federal and state organizations. These policies and algorithms were followed during the patient's care in the ED.    Patient presents with elevated sugars.  Will get labs evaluate for electrolyte abnormalities, DKA, UTI, AKI.  Will give fluids and reassess.  Labs reveal elevated sugar without elevated anion gap.  Therefore patient is not in DKA.  She does have a minimal amount of ketones in urine so we will continue to fluid resuscitate her.  Her urine has 6-10 WBCs but she does have significant of squamous cells in them and she denies urinary symptoms.  Will send for culture but I doubt this reflects a UTI at this time.  7:55 AM reevaluated patient and her nausea is better.  9:21 AM reevaluated patient and her sugars have come down to 350 after 1 liter of fluid.  Patient still little bit tachycardic and and nauseous.  Will give another liter of fluids and some more nausea medicine.  Patient requesting some subcu insulin given her concerned that the cloudiness of her insulin is making it not work.  Patient states that she would normally take 6 units of NovoLog.  We will give her 6 units subcu.   Reevaluated patient and sugars have come down to the 200s.  Patient is no longer tachycardic.  Patient is not had any vomiting and is tolerating p.o.  Patient feels comfortable with being discharged home.  We will give her a new prescription for her NovoLog to put back into her  pump given she is having difficulties filling her prescription that she has at her current pharmacy.  I discussed with patient calling her endocrinologist and letting them know what happened tomorrow.  We also discussed return precautions related to DKA  I discussed the provisional nature of ED diagnosis, the treatment so far, the ongoing plan of care, follow up appointments and return precautions with the patient and any family or support people present. They expressed understanding and agreed with the plan, discharged  home.     ____________________________________________   FINAL CLINICAL IMPRESSION(S) / ED DIAGNOSES   Final diagnoses:  Hyperglycemia  Type 1 diabetes mellitus with other specified complication (HCC)      MEDICATIONS GIVEN DURING THIS VISIT:  Medications  sodium chloride 0.9 % bolus 1,000 mL (0 mLs Intravenous Stopped 07/13/19 0858)  ondansetron (ZOFRAN) injection 4 mg (4 mg Intravenous Given 07/13/19 0730)  lactated ringers bolus 1,000 mL (1,000 mLs Intravenous New Bag/Given 07/13/19 0902)  ondansetron (ZOFRAN) injection 4 mg (4 mg Intravenous Given 07/13/19 0913)  insulin aspart (novoLOG) injection 6 Units (6 Units Subcutaneous Given 07/13/19 0933)     ED Discharge Orders         Ordered    insulin aspart (NOVOLOG) 100 UNIT/ML injection     07/13/19 1217           Note:  This document was prepared using Dragon voice recognition software and may include unintentional dictation errors.   Concha Se, MD 07/13/19 [phone]

## 2019-07-13 NOTE — Discharge Instructions (Signed)
Urine did not have any evidence of obvious UTI but we did send it for culture.  If it grows anything that let you know.  We have gotten your sugars down and prescribed you a new prescription in order to fill your insulin pump.  Return to the ER if you develop worsening vomiting, abdominal pain or worsening sugars or any other concerns.  Otherwise call your endocrinologist tomorrow and let them know what happened.

## 2019-07-14 LAB — URINE CULTURE: Culture: 60000 — AB

## 2019-07-14 LAB — POCT PREGNANCY, URINE: Preg Test, Ur: NEGATIVE

## 2019-09-11 ENCOUNTER — Other Ambulatory Visit: Payer: Self-pay

## 2019-09-11 ENCOUNTER — Ambulatory Visit (INDEPENDENT_AMBULATORY_CARE_PROVIDER_SITE_OTHER): Payer: Managed Care, Other (non HMO) | Admitting: Obstetrics and Gynecology

## 2019-09-11 ENCOUNTER — Encounter: Payer: Self-pay | Admitting: Obstetrics and Gynecology

## 2019-09-11 DIAGNOSIS — R102 Pelvic and perineal pain: Secondary | ICD-10-CM | POA: Diagnosis not present

## 2019-09-11 DIAGNOSIS — Z30431 Encounter for routine checking of intrauterine contraceptive device: Secondary | ICD-10-CM

## 2019-09-11 NOTE — Progress Notes (Signed)
Jeanne Mosley presents with c/o intermittent abd/pelvic pain since Feb. Wonders if related to her IUD ParaGard IUD placed 11/20. Cycles are monthly since placement. Last @ 6 days, not heavy or painful. Has been sexual active since placement without problems.  Describes pain as crampy, and achy at times. Does not have every day. Has not tried any OTC meds. Denies any bowel or bladder dysfunction.  PE AF VSS Lungs clear  Heart RRR Abd soft + BS GU Nl EGBUS, white vaginal discharge, IUD strings noted, bladder non tender, uterus small mobile, non tender, no adnexal tenderness  A/P Abd/Pelvic pain        IUD check up.  Pt reassured. Will check GYN U/S.  F/U per U/S results.

## 2019-09-18 ENCOUNTER — Other Ambulatory Visit: Payer: Self-pay

## 2019-09-18 ENCOUNTER — Ambulatory Visit (HOSPITAL_COMMUNITY)
Admission: RE | Admit: 2019-09-18 | Discharge: 2019-09-18 | Disposition: A | Discharge status: Home / Self Care | Payer: Managed Care, Other (non HMO) | Source: Ambulatory Visit | Attending: Obstetrics and Gynecology | Admitting: Obstetrics and Gynecology

## 2019-09-18 DIAGNOSIS — R102 Pelvic and perineal pain: Secondary | ICD-10-CM | POA: Insufficient documentation

## 2019-10-11 ENCOUNTER — Encounter (HOSPITAL_COMMUNITY): Payer: Self-pay

## 2019-10-11 ENCOUNTER — Emergency Department (HOSPITAL_COMMUNITY)
Admission: EM | Admit: 2019-10-11 | Discharge: 2019-10-12 | Disposition: A | Discharge status: Home / Self Care | Payer: Managed Care, Other (non HMO) | Attending: Emergency Medicine | Admitting: Emergency Medicine

## 2019-10-11 DIAGNOSIS — N760 Acute vaginitis: Secondary | ICD-10-CM | POA: Diagnosis not present

## 2019-10-11 DIAGNOSIS — R110 Nausea: Secondary | ICD-10-CM | POA: Diagnosis not present

## 2019-10-11 DIAGNOSIS — R102 Pelvic and perineal pain: Secondary | ICD-10-CM

## 2019-10-11 DIAGNOSIS — R1031 Right lower quadrant pain: Secondary | ICD-10-CM | POA: Insufficient documentation

## 2019-10-11 DIAGNOSIS — R10817 Generalized abdominal tenderness: Secondary | ICD-10-CM | POA: Insufficient documentation

## 2019-10-11 DIAGNOSIS — G35 Multiple sclerosis: Secondary | ICD-10-CM | POA: Diagnosis not present

## 2019-10-11 DIAGNOSIS — B9689 Other specified bacterial agents as the cause of diseases classified elsewhere: Secondary | ICD-10-CM | POA: Diagnosis not present

## 2019-10-11 DIAGNOSIS — E109 Type 1 diabetes mellitus without complications: Secondary | ICD-10-CM | POA: Insufficient documentation

## 2019-10-11 DIAGNOSIS — R1011 Right upper quadrant pain: Secondary | ICD-10-CM

## 2019-10-11 LAB — CBC
HCT: 37 % (ref 36.0–46.0)
Hemoglobin: 11.5 g/dL — ABNORMAL LOW (ref 12.0–15.0)
MCH: 27.8 pg (ref 26.0–34.0)
MCHC: 31.1 g/dL (ref 30.0–36.0)
MCV: 89.6 fL (ref 80.0–100.0)
Platelets: 368 10*3/uL (ref 150–400)
RBC: 4.13 MIL/uL (ref 3.87–5.11)
RDW: 13.9 % (ref 11.5–15.5)
WBC: 7.8 10*3/uL (ref 4.0–10.5)
nRBC: 0 % (ref 0.0–0.2)

## 2019-10-11 LAB — COMPREHENSIVE METABOLIC PANEL
ALT: 20 U/L (ref 0–44)
AST: 27 U/L (ref 15–41)
Albumin: 3.6 g/dL (ref 3.5–5.0)
Alkaline Phosphatase: 49 U/L (ref 38–126)
Anion gap: 11 (ref 5–15)
BUN: 6 mg/dL (ref 6–20)
CO2: 26 mmol/L (ref 22–32)
Calcium: 8.8 mg/dL — ABNORMAL LOW (ref 8.9–10.3)
Chloride: 99 mmol/L (ref 98–111)
Creatinine, Ser: 0.47 mg/dL (ref 0.44–1.00)
GFR calc Af Amer: >60 mL/min (ref >60)
GFR calc non Af Amer: >60 mL/min (ref >60)
Glucose, Bld: 135 mg/dL — ABNORMAL HIGH (ref 70–99)
Potassium: 3.9 mmol/L (ref 3.5–5.1)
Sodium: 136 mmol/L (ref 135–145)
Total Bilirubin: 0.4 mg/dL (ref 0.3–1.2)
Total Protein: 6.8 g/dL (ref 6.5–8.1)

## 2019-10-11 LAB — LIPASE, BLOOD: Lipase: 21 U/L (ref 11–51)

## 2019-10-11 LAB — I-STAT BETA HCG BLOOD, ED (MC, WL, AP ONLY): I-stat hCG, quantitative: <5 m[IU]/mL (ref <5)

## 2019-10-11 MED ORDER — SODIUM CHLORIDE 0.9% FLUSH: 3.0000 mL | Freq: Once | INTRAVENOUS | Status: DC

## 2019-10-11 NOTE — ED Triage Notes (Signed)
Onset today abd pain "all over".  Denies N/V/D.

## 2019-10-12 ENCOUNTER — Emergency Department (HOSPITAL_COMMUNITY): Payer: Managed Care, Other (non HMO)

## 2019-10-12 DIAGNOSIS — N760 Acute vaginitis: Secondary | ICD-10-CM | POA: Diagnosis not present

## 2019-10-12 LAB — WET PREP, GENITAL
Sperm: NONE SEEN
Trich, Wet Prep: NONE SEEN
Yeast Wet Prep HPF POC: NONE SEEN

## 2019-10-12 LAB — URINALYSIS, ROUTINE W REFLEX MICROSCOPIC
Bilirubin Urine: NEGATIVE
Glucose, UA: 50 mg/dL — AB
Hgb urine dipstick: NEGATIVE
Ketones, ur: 5 mg/dL — AB
Nitrite: NEGATIVE
Protein, ur: NEGATIVE mg/dL
Specific Gravity, Urine: 1.018 (ref 1.005–1.030)
pH: 5 (ref 5.0–8.0)

## 2019-10-12 MED ORDER — CEFTRIAXONE SODIUM 500 MG IJ SOLR
500.0000 mg | Freq: Once | INTRAMUSCULAR | Status: AC
  Administered 2019-10-12: 500 mg via INTRAMUSCULAR
  Filled 2019-10-12: qty 500

## 2019-10-12 MED ORDER — DOXYCYCLINE HYCLATE 100 MG PO CAPS
100.0000 mg | ORAL_CAPSULE | Freq: Two times a day (BID) | ORAL | 0 refills | Status: DC
Start: 2019-10-12 — End: 2019-10-31

## 2019-10-12 MED ORDER — SODIUM CHLORIDE 0.9 % IV BOLUS
1000.0000 mL | Freq: Once | INTRAVENOUS | Status: AC
  Administered 2019-10-12: 1000 mL via INTRAVENOUS

## 2019-10-12 MED ORDER — STERILE WATER FOR INJECTION IJ SOLN
INTRAMUSCULAR | Status: AC
  Filled 2019-10-12: qty 10

## 2019-10-12 MED ORDER — METRONIDAZOLE 500 MG PO TABS
500.0000 mg | ORAL_TABLET | Freq: Two times a day (BID) | ORAL | 0 refills | Status: DC
Start: 2019-10-12 — End: 2019-10-31

## 2019-10-12 MED ORDER — DOXYCYCLINE HYCLATE 100 MG PO TABS
100.0000 mg | ORAL_TABLET | Freq: Once | ORAL | Status: AC
  Administered 2019-10-12: 05:00:00 100 mg via ORAL
  Filled 2019-10-12: qty 1

## 2019-10-12 MED ORDER — ONDANSETRON HCL 4 MG/2ML IJ SOLN
4.0000 mg | Freq: Once | INTRAMUSCULAR | Status: AC
  Administered 2019-10-12: 4 mg via INTRAVENOUS
  Filled 2019-10-12: qty 2

## 2019-10-12 MED ORDER — IOHEXOL 300 MG/ML  SOLN
100.0000 mL | Freq: Once | INTRAMUSCULAR | Status: AC | PRN
  Administered 2019-10-12: 100 mL via INTRAVENOUS

## 2019-10-12 MED ORDER — FENTANYL CITRATE (PF) 100 MCG/2ML IJ SOLN
50.0000 ug | Freq: Once | INTRAMUSCULAR | Status: AC
  Administered 2019-10-12: 50 ug via INTRAVENOUS
  Filled 2019-10-12: qty 2

## 2019-10-12 NOTE — Discharge Instructions (Signed)
Take the antibiotics as prescribed for bacterial vaginosis as well as possible pelvic inflammatory disease.  Do not drink alcohol while taking metronidazole.  Your sexual partner should be treated as well.  Follow-up with the gynecologist regarding the position of your IUD.  Keep an eye on your blood sugars and keep yourself hydrated at home.  Return to the ED with worsening pain, fever, vomiting, any other concerns.

## 2019-10-12 NOTE — ED Notes (Signed)
Patient verbalizes understanding of discharge instructions. Opportunity for questioning and answers were provided. Armband removed by staff, pt discharged from ED. Pt. ambulatory and discharged home.  

## 2019-10-12 NOTE — ED Provider Notes (Signed)
[facility] EMERGENCY DEPARTMENT Provider Note   CSN: [id number] Arrival date & time: 10/11/19  2143     History Chief Complaint  Patient presents with  . Abdominal Pain    Jeanne Mosley is a 34 y.o. female.  Patient presents with upper abdominal pain onset this evening.  She is a type I diabetic with insulin pump as well as history of multiple sclerosis.  She reports diffuse abdominal pain worse in the epigastrium and right upper quadrant.  The pain is fairly constant moves across her entire abdomen.  Nothing makes it better or worse.  She said nausea but no vomiting.  No diarrhea.  No fever.  No pain with urination or blood in the urine.  No vaginal bleeding or discharge.  She has an IUD that is copper and she worries that she could be having allergic reaction to it.  No vaginal bleeding or discharge.  She still has an appendix or gallbladder.  She has had this pain in the past with a clear diagnosis.  No chest pain or shortness of breath.  The history is provided by the patient.  Abdominal Pain Associated symptoms: nausea   Associated symptoms: no chest pain, no cough, no diarrhea, no dysuria, no fatigue, no fever, no hematuria, no shortness of breath and no vomiting        Past Medical History:  Diagnosis Date  . Cervical dysplasia   . Depression   . DM (diabetes mellitus), type 1 (HCC)   . MS (multiple sclerosis) (HCC)   . UTI (urinary tract infection)     Patient Active Problem List   Diagnosis Date Noted  . Pelvic pain 09/11/2019  . IUD check up 06/18/2019  . Encounter for insertion of copper IUD 04/15/2019  . Depression 02/19/2019  . Pain due to onychomycosis of toenails of both feet 02/05/2019  . Diabetes mellitus type I (HCC) 02/05/2019  . Seasonal allergic rhinitis 01/03/2019  . Proliferative diabetic retinopathy (HCC) 09/30/2018  . Human papillomavirus in conditions classified elsewhere and of unspecified site 09/30/2018  . Atypical  squamous cell changes of cervix undetermined significance favor benign 09/30/2018  . Chronic constipation 08/14/2018  . DKA (diabetic ketoacidoses) (HCC) 01/15/2017  . Multiple sclerosis (HCC) 11/24/2016  . Leukocytosis 11/24/2016  . 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 Uncontrolled type 1 diabetes mellitus with diabetic retinopathy (HCC) 11/24/2016    Past Surgical History:  Procedure Laterality Date  . CERVIX LESION DESTRUCTION    . CESAREAN SECTION    . EYE SURGERY     x4     OB History    Gravida  3   Para  1   Term  1   Preterm  0   AB  2   Living  1     SAB  1   TAB  1   Ectopic  0   Multiple  0   Live Births  1        Obstetric Comments  c-section x 1. SAB x 1. EAB (medical) x 1        History reviewed. No pertinent family history.  Social History   Tobacco Use  . Smoking status: Never Smoker  . Smokeless tobacco: Never Used  Substance Use Topics  . Alcohol use: Yes  . Drug use: No    Home Medications Prior to Admission medications   Medication Sig Start Date End Date Taking? Authorizing Provider  Dimethyl Fumarate 240 MG CPDR Take by mouth.    [provider]  famotidine (PEPCID) 20 MG tablet Take 1 tablet (20 mg total) by mouth 2 (two) times daily. 05/27/19 06/26/19  Miguel Aschoff., MD  HUMALOG 100 UNIT/ML injection  01/24/19   [provider]  insulin aspart (NOVOLOG) 100 UNIT/ML injection Insulin pump 07/13/19   Concha Se, MD  insulin glargine (LANTUS) 100 UNIT/ML injection Inject 0.22 mLs (22 Units total) into the skin at bedtime. 01/16/17 02/15/17  Arrien, York Ram, MD  Needles & Syringes MISC Please give insulin syringes and needles. 01/16/17   Arrien, York Ram, MD    Allergies    Sulfamethoxazole-trimethoprim, Aspartame, Sulfa antibiotics, and Sweetness enhancer  Review of Systems   Review of Systems  Constitutional: Positive for  activity change and appetite change. Negative for fatigue and fever.  HENT: Negative for congestion and rhinorrhea.   Eyes: Negative for visual disturbance.  Respiratory: Negative for cough and shortness of breath.   Cardiovascular: Negative for chest pain.  Gastrointestinal: Positive for abdominal pain and nausea. Negative for diarrhea and vomiting.  Genitourinary: Negative for dysuria and hematuria.  Musculoskeletal: Negative for arthralgias, back pain and myalgias.  Skin: Negative for rash.  Neurological: Negative for dizziness, weakness and headaches.   all other systems are negative except as noted in the HPI and PMH.    Physical Exam Updated Vital Signs BP 113/80   Pulse 84   Temp 98.6 F (37 C) (Oral)   Resp 16   SpO2 100%   Physical Exam Vitals and nursing note reviewed.  Constitutional:      General: She is not in acute distress.    Appearance: She is well-developed.  HENT:     Head: Normocephalic and atraumatic.     Mouth/Throat:     Pharynx: No oropharyngeal exudate.  Eyes:     Conjunctiva/sclera: Conjunctivae normal.     Pupils: Pupils are equal, round, and reactive to light.  Neck:     Comments: No meningismus. Cardiovascular:     Rate and Rhythm: Normal rate and regular rhythm.     Heart sounds: Normal heart sounds. No murmur.  Pulmonary:     Effort: Pulmonary effort is normal. No respiratory distress.     Breath sounds: Normal breath sounds.  Abdominal:     Palpations: Abdomen is soft.     Tenderness: There is abdominal tenderness. There is no guarding or rebound.     Comments: Soft abdomen, mild diffuse tenderness worse in the epigastrium.  No guarding or rebound.  There is right upper quadrant tenderness as well.  No lower abdominal tenderness  Genitourinary:    Comments: Chaperone Present Danielle, NT.  Normal external genitalia.  White discharge in vaginal vault.  IUD strings in place.  No CMT.  There is midline left-sided adnexal  tenderness. Musculoskeletal:        General: No tenderness. Normal range of motion.     Cervical back: Normal range of motion and neck supple.     Comments: No CVA tenderness  Skin:    General: Skin is warm.     Capillary Refill: Capillary refill takes less than 2 seconds.  Neurological:     General: No focal deficit present.     Mental Status: She is alert and oriented to person, place, and time. Mental status is at baseline.     Cranial Nerves: No cranial nerve deficit.     Motor: No abnormal muscle tone.  Coordination: Coordination normal.     Comments:  5/5 strength throughout. CN 2-12 intact.Equal grip strength.   Psychiatric:        Behavior: Behavior normal.     ED Results / Procedures / Treatments   Labs (all labs ordered are listed, but only abnormal results are displayed) Labs Reviewed  WET PREP, GENITAL - Abnormal; Notable for the following components:      Result Value   Clue Cells Wet Prep HPF POC PRESENT (*)    WBC, Wet Prep HPF POC MODERATE (*)    All other components within normal limits  COMPREHENSIVE METABOLIC PANEL - Abnormal; Notable for the following components:   Glucose, Bld 135 (*)    Calcium 8.8 (*)    All other components within normal limits  CBC - Abnormal; Notable for the following components:   Hemoglobin 11.5 (*)    All other components within normal limits  URINALYSIS, ROUTINE W REFLEX MICROSCOPIC - Abnormal; Notable for the following components:   APPearance CLOUDY (*)    Glucose, UA 50 (*)    Ketones, ur 5 (*)    Leukocytes,Ua LARGE (*)    Bacteria, UA RARE (*)    All other components within normal limits  URINE CULTURE  LIPASE, BLOOD  I-STAT BETA HCG BLOOD, ED ([facility], [facility], [facility] ONLY)  CBG MONITORING, ED  GC/CHLAMYDIA PROBE AMP ([place]) NOT AT [facility]    EKG None  Radiology US PELVIS (TRANSABDOMINAL ONLY)  Result Date: 10/12/2019 CLINICAL DATA:  Initial evaluation for acute pelvic pain. EXAM: TRANSABDOMINAL ULTRASOUND OF PELVIS  DOPPLER ULTRASOUND OF OVARIES TECHNIQUE: Transabdominal ultrasound examination of the pelvis was performed including evaluation of the uterus, ovaries, adnexal regions, and pelvic cul-de-sac. Color and duplex Doppler ultrasound was utilized to evaluate blood flow to the ovaries. COMPARISON:  Prior CT from earlier the same day. FINDINGS: Uterus Measurements: 9.6 x 3.4 x 4.4 cm = volume: 73.1 mL. No fibroids or other mass visualized. Endometrium Thickness: 6.8 mm. No focal abnormality visualized. IUD in appropriate position within the endometrial cavity. Right ovary Measurements: 4.0 x 2.4 x 3.2 cm = volume: 15.9 mL. 1.6 x 1.7 x 1.5 cm dominant follicle noted. No other adnexal mass. Left ovary Measurements: 6.6 x 4.2 x 5.0 cm = volume: 70.1 mL. 4.0 x 4.5 x 4.5 cm complex cyst with internal lace-like architecture, most consistent with a benign hemorrhagic cyst. No internal solid component or vascularity. Pulsed Doppler evaluation demonstrates normal low-resistance arterial and venous waveforms in both ovaries. Other: No free fluid. IMPRESSION: 1. 4.5 cm complex left ovarian cyst, most consistent with a benign hemorrhagic cyst. 2. No evidence for ovarian torsion or other acute abnormality. 3. IUD in appropriate position within the endometrial cavity. Electronically Signed   By: Rise Mu M.D.   On: 10/12/2019 04:16   CT ABDOMEN PELVIS W CONTRAST  Result Date: 10/12/2019 CLINICAL DATA:  34 year old female with epigastric pain. EXAM: CT ABDOMEN AND PELVIS WITH CONTRAST TECHNIQUE: Multidetector CT imaging of the abdomen and pelvis was performed using the standard protocol following bolus administration of intravenous contrast. CONTRAST:  OMNIPAQUE IOHEXOL 300 MG/ML  SOLN COMPARISON:  Abdominal ultrasound dated 10/12/2019. FINDINGS: Lower chest: The visualized lung bases are clear. Mild cardiomegaly. No intra-abdominal free air or free fluid. Hepatobiliary: Ill-defined 15 mm hypodense lesion in the  inferior right lobe of the liver not characterized on this CT. There is slight heterogeneous enhancement of the liver which may be related to mild fatty infiltration. Clinical correlation  is recommended to exclude hepatitis. The gallbladder is unremarkable. Pancreas: Unremarkable. No pancreatic ductal dilatation or surrounding inflammatory changes. Spleen: Normal in size without focal abnormality. Adrenals/Urinary Tract: The adrenal glands are. Mild bilateral renal cortical lobulation and irregularity, likely scarring. There is no hydronephrosis on either side. The visualized ureters and urinary bladder appear unremarkable. Stomach/Bowel: There is mild thickened appearance of the wall of the stomach which may be related to underdistention or represent mild gastritis. Clinical correlation is recommended. There is moderate stool throughout the colon. There is no bowel obstruction. The appendix is normal. Vascular/Lymphatic: The abdominal aorta and IVC unremarkable. No portal venous gas. There is no adenopathy. Reproductive: The uterus is anteverted. An intrauterine device is noted which appears low in positioning with probable penetration of the arms into the myometrium. Correlation with pelvic ultrasound on a nonemergent/outpatient basis recommended. There is a 4 cm left adnexal dominant cyst. The right ovary is unremarkable. Other: None Musculoskeletal: No acute or significant osseous findings. IMPRESSION: 1. Slight heterogeneity of the liver. Correlation with liver enzymes recommended to exclude hepatitis. 2. Underdistention of the stomach versus gastritis. Clinical correlation is recommended. No bowel obstruction. Normal appendix. 3. Inferiorly mild positioned IUD with probable myometrial penetration. Correlation with pelvic ultrasound on a nonemergent/outpatient basis recommended. A 4 cm left adnexal dominant cyst. Electronically Signed   By: Anner Crete M.D.   On: 10/12/2019 02:33   US PELVIC DOPPLER  (TORSION R/O OR MASS ARTERIAL FLOW)  Result Date: 10/12/2019 CLINICAL DATA:  Initial evaluation for acute pelvic pain. EXAM: TRANSABDOMINAL ULTRASOUND OF PELVIS DOPPLER ULTRASOUND OF OVARIES TECHNIQUE: Transabdominal ultrasound examination of the pelvis was performed including evaluation of the uterus, ovaries, adnexal regions, and pelvic cul-de-sac. Color and duplex Doppler ultrasound was utilized to evaluate blood flow to the ovaries. COMPARISON:  Prior CT from earlier the same day. FINDINGS: Uterus Measurements: 9.6 x 3.4 x 4.4 cm = volume: 73.1 mL. No fibroids or other mass visualized. Endometrium Thickness: 6.8 mm. No focal abnormality visualized. IUD in appropriate position within the endometrial cavity. Right ovary Measurements: 4.0 x 2.4 x 3.2 cm = volume: 15.9 mL. 1.6 x 1.7 x 1.5 cm dominant follicle noted. No other adnexal mass. Left ovary Measurements: 6.6 x 4.2 x 5.0 cm = volume: 70.1 mL. 4.0 x 4.5 x 4.5 cm complex cyst with internal lace-like architecture, most consistent with a benign hemorrhagic cyst. No internal solid component or vascularity. Pulsed Doppler evaluation demonstrates normal low-resistance arterial and venous waveforms in both ovaries. Other: No free fluid. IMPRESSION: 1. 4.5 cm complex left ovarian cyst, most consistent with a benign hemorrhagic cyst. 2. No evidence for ovarian torsion or other acute abnormality. 3. IUD in appropriate position within the endometrial cavity. Electronically Signed   By: Jeannine Boga M.D.   On: 10/12/2019 04:16   US Abdomen Limited RUQ  Result Date: 10/12/2019 CLINICAL DATA:  34 year old female with right upper quadrant abdominal pain. EXAM: ULTRASOUND ABDOMEN LIMITED RIGHT UPPER QUADRANT COMPARISON:  Ultrasound dated 05/27/2019. FINDINGS: Gallbladder: No gallstones or wall thickening visualized. No sonographic Murphy sign noted by sonographer. Common bile duct: Diameter: 3 mm Liver: No focal lesion identified. Within normal limits in  parenchymal echogenicity. Portal vein is patent on color Doppler imaging with normal direction of blood flow towards the liver. Other: None. IMPRESSION: Unremarkable right upper quadrant ultrasound. Electronically Signed   By: Anner Crete M.D.   On: 10/12/2019 01:01    Procedures Procedures (including critical care time)  Medications Ordered in ED Medications  sodium chloride flush (NS) 0.9 % injection 3 mL (has no administration in time range)  fentaNYL (SUBLIMAZE) injection 50 mcg (has no administration in time range)  ondansetron (ZOFRAN) injection 4 mg (has no administration in time range)  sodium chloride 0.9 % bolus 1,000 mL (has no administration in time range)    ED Course  I have reviewed the triage vital signs and the nursing notes.  Pertinent labs & imaging results that were available during my care of the patient were reviewed by me and considered in my medical decision making (see chart for details).    MDM Rules/Calculators/A&P                     Diabetic here with abdominal pain and nausea.  No vomiting.  No fever.  She has tenderness with her upper quadrant and epigastrium.  No peritoneal signs.  Labs reassuring with normal anion gap.  No evidence of DKA.  LFTs and lipase are normal.  Right upper quadrant ultrasound is negative  Does appear to have malpositioned IUD with myometrial penetration on CT scan.  This was discussed with Dr. Lelon Mast of gynecology who states no further intervention needed tonight and recommends outpatient follow-up.  Okay to treat for PID as usual.  Ultrasound shows left-sided ovarian cyst.  IUD in appropriate position.  No evidence of ovarian torsion.  We will treat for bacterial vaginosis and suspected PID.  CT scan also with evidence of gastritis and will continue PPI. Patient tolerating p.o. in the ED.  No evidence of DKA  Follow-up with PCP in her gynecologist.  Advised to have her sexual partners treated as well.  Return to the  ED with new or worsening symptoms. Final Clinical Impression(s) / ED Diagnoses Final diagnoses:  RUQ pain  Pelvic pain in female  Bacterial vaginosis    Rx / DC Orders ED Discharge Orders    None       Loyalty Brashier, Jeannett Senior, MD 10/12/19 [phone]

## 2019-10-13 ENCOUNTER — Telehealth: Payer: Self-pay | Admitting: *Deleted

## 2019-10-13 LAB — GC/CHLAMYDIA PROBE AMP (~~LOC~~) NOT AT ARMC
Chlamydia: NEGATIVE
Comment: NEGATIVE
Comment: NORMAL
Neisseria Gonorrhea: NEGATIVE

## 2019-10-13 LAB — URINE CULTURE: Culture: >=100000 — AB

## 2019-10-13 NOTE — Telephone Encounter (Signed)
Pt called regarding feeling nauseous after taking medication.  Pt states she called her PCP and they advised her to call ED for nausea Rx.  RNCM advised to reach ou to PCP again as prescribing EDP is not available.

## 2019-10-14 ENCOUNTER — Telehealth: Payer: Self-pay | Admitting: *Deleted

## 2019-10-14 NOTE — Telephone Encounter (Signed)
Post ED Visit - Positive Culture Follow-up  Culture report reviewed by antimicrobial stewardship pharmacist: [facility] []  , Pharm.D. []  Enzo Bi, Pharm.D., BCPS AQ-ID []  , Pharm.D., BCPS []  Celedonio Miyamoto, Pharm.D., BCPS []  [place], Garvin Fila.D., BCPS, AAHIVP []  , Pharm.D., BCPS, AAHIVP []  Georgina Pillion, PharmD, BCPS []  , PharmD, BCPS []  [place], PharmD, BCPS []  [address], PharmD []  , PharmD, BCPS []  Estella Husk, PharmD , Pharm [facility] []  , PharmD []  Phillips Climes, PharmD []  , PharmD []  Agapito Games, Rph []  ) Verlan Friends, PharmD []  , PharmD []  Mervyn Gay, PharmD []  , PharmD []  Vinnie Level, PharmD []  Cherlyn Cushing, PharmD []  Autumn Patty, PharmD []  , PharmD []  Len Childs, PharmD   Positive Urine culture Treated with Doxycycline Hyclate, organism sensitive to the same and no further patient follow-up is required at this time.  [facility] 10/14/2019, 10:46 AM

## 2019-10-31 ENCOUNTER — Ambulatory Visit (INDEPENDENT_AMBULATORY_CARE_PROVIDER_SITE_OTHER): Payer: Medicaid Other | Admitting: Obstetrics and Gynecology

## 2019-10-31 ENCOUNTER — Other Ambulatory Visit: Payer: Self-pay

## 2019-10-31 ENCOUNTER — Encounter: Payer: Self-pay | Admitting: Obstetrics and Gynecology

## 2019-10-31 VITALS — BP 116/80 | HR 89 | Wt 145.9 lb

## 2019-10-31 DIAGNOSIS — Z30016 Encounter for initial prescription of transdermal patch hormonal contraceptive device: Secondary | ICD-10-CM

## 2019-10-31 DIAGNOSIS — Z30432 Encounter for removal of intrauterine contraceptive device: Secondary | ICD-10-CM

## 2019-10-31 DIAGNOSIS — R102 Pelvic and perineal pain: Secondary | ICD-10-CM

## 2019-10-31 MED ORDER — NORELGESTROMIN-ETH ESTRADIOL 150-35 MCG/24HR TD PTWK: 1.0000 | MEDICATED_PATCH | TRANSDERMAL | 12 refills | Status: DC

## 2019-10-31 NOTE — Progress Notes (Signed)
    GYNECOLOGY CLINIC PROCEDURE NOTE  Jeanne Mosley is a 34 y.o. [id number] here for ParaGard IUD removal. No GYN concerns.    IUD Removal  Patient identified, informed consent performed, consent signed.  Patient was in the dorsal lithotomy position, normal external genitalia was noted.  A speculum was placed in the patient's vagina, normal discharge was noted, no lesions. The cervix was visualized, no lesions, no abnormal discharge.  The strings of the IUD were grasped and pulled using ring forceps. The IUD was removed in its entirety.   Patient tolerated the procedure well.    Patient will use Ortho Evra for contraception.   Routine preventative health maintenance measures emphasized.   Jeanne Elm, MD, FACOG Attending Obstetrician & Gynecologist Center for [facility], [facility]

## 2019-11-10 ENCOUNTER — Telehealth: Payer: Self-pay

## 2019-11-10 NOTE — Telephone Encounter (Addendum)
Pt called and left message that was unclear.  Called pt and left VM that I am returning her call if she could please return call if she continues to have questions or concerns.   [name], RN  11/10/19  Pt returned call with c/o that the patch is causing her to have a lump that is tender after she removes the patch.  Pt states that she wants to switch to a low dose BCP.  Pt reports that she took the patch off today.  I advised pt that I would send a message to the provider for a prescription and use condoms.  Verified pt's pharmacy.  Message sent to Dr. Alysia Penna for advisement.   [name], RN

## 2019-11-12 ENCOUNTER — Other Ambulatory Visit: Payer: Self-pay | Admitting: Obstetrics and Gynecology

## 2019-11-12 DIAGNOSIS — Z30011 Encounter for initial prescription of contraceptive pills: Secondary | ICD-10-CM

## 2019-11-12 MED ORDER — NORETHIN ACE-ETH ESTRAD-FE 1-20 MG-MCG(24) PO TABS: 1.0000 | ORAL_TABLET | Freq: Every day | ORAL | 11 refills | Status: DC

## 2019-11-13 ENCOUNTER — Telehealth: Payer: Self-pay

## 2019-11-13 NOTE — Telephone Encounter (Signed)
Pt called and stated that she is having some pelvic pain and has not started the BCP.  I called pt and asked if she received Dr. Alysia Penna MyChart message about getting BCP Rx sent to her pharmacy and that she can start taking it on Sunday.  Pt stated that she did get his message.  I also informed pt that taking the BCP's will help with the pelvic pain.  I strongly encouraged her to take the, and that after a couple of pills packs she should tell a difference.  Pt verbalized understanding.   [name], RN  11/13/19

## 2019-11-15 ENCOUNTER — Other Ambulatory Visit: Payer: Self-pay

## 2019-11-15 ENCOUNTER — Emergency Department: Payer: Medicaid Other

## 2019-11-15 ENCOUNTER — Emergency Department
Admission: EM | Admit: 2019-11-15 | Discharge: 2019-11-15 | Disposition: A | Discharge status: Home / Self Care | Payer: Medicaid Other | Attending: Emergency Medicine | Admitting: Emergency Medicine

## 2019-11-15 ENCOUNTER — Encounter: Payer: Self-pay | Admitting: Emergency Medicine

## 2019-11-15 DIAGNOSIS — Y929 Unspecified place or not applicable: Secondary | ICD-10-CM | POA: Diagnosis not present

## 2019-11-15 DIAGNOSIS — S6991XA Unspecified injury of right wrist, hand and finger(s), initial encounter: Secondary | ICD-10-CM | POA: Insufficient documentation

## 2019-11-15 DIAGNOSIS — W010XXA Fall on same level from slipping, tripping and stumbling without subsequent striking against object, initial encounter: Secondary | ICD-10-CM | POA: Insufficient documentation

## 2019-11-15 DIAGNOSIS — Y939 Activity, unspecified: Secondary | ICD-10-CM | POA: Diagnosis not present

## 2019-11-15 DIAGNOSIS — Z23 Encounter for immunization: Secondary | ICD-10-CM | POA: Insufficient documentation

## 2019-11-15 DIAGNOSIS — E109 Type 1 diabetes mellitus without complications: Secondary | ICD-10-CM | POA: Insufficient documentation

## 2019-11-15 DIAGNOSIS — Z79899 Other long term (current) drug therapy: Secondary | ICD-10-CM | POA: Diagnosis not present

## 2019-11-15 DIAGNOSIS — Y999 Unspecified external cause status: Secondary | ICD-10-CM | POA: Diagnosis not present

## 2019-11-15 MED ORDER — KETOROLAC TROMETHAMINE 30 MG/ML IJ SOLN
30.0000 mg | Freq: Once | INTRAMUSCULAR | Status: AC
  Administered 2019-11-15: 30 mg via INTRAMUSCULAR
  Filled 2019-11-15: qty 1

## 2019-11-15 MED ORDER — IBUPROFEN 600 MG PO TABS
600.0000 mg | ORAL_TABLET | Freq: Four times a day (QID) | ORAL | 0 refills | Status: DC | PRN
Start: 2019-11-15 — End: 2022-07-05

## 2019-11-15 MED ORDER — TETANUS-DIPHTH-ACELL PERTUSSIS 5-2.5-18.5 LF-MCG/0.5 IM SUSP
0.5000 mL | Freq: Once | INTRAMUSCULAR | Status: AC
  Administered 2019-11-15: 0.5 mL via INTRAMUSCULAR
  Filled 2019-11-15: qty 0.5

## 2019-11-15 NOTE — ED Provider Notes (Signed)
[facility] Emergency Department Provider Note  ____________________________________________  Time seen: Approximately 11:17 AM  I have reviewed the triage vital signs and the nursing notes.   HISTORY  Chief Complaint Wrist Pain    HPI Jeanne Mosley is a 34 y.o. female that presents to the emergency department for evaluation of right wrist pain after a fall yesterday. Patient was chasing after her son outside of the apple store when she trip and fell. She wasn't having any pain at first. She has a small abrasion to her left hand. She has been putting antibiotic ointment on abrasions. She has not taken any oral medications. Her husband tried to massage it last night. No numbness, tingling.   Past Medical History:  Diagnosis Date  . Cervical dysplasia   . Depression   . DM (diabetes mellitus), type 1 (HCC)   . MS (multiple sclerosis) (HCC)   . UTI (urinary tract infection)     Patient Active Problem List   Diagnosis Date Noted  . Pelvic pain 09/11/2019  . Depression 02/19/2019  . Pain due to onychomycosis of toenails of both feet 02/05/2019  . Diabetes mellitus type I (HCC) 02/05/2019  . Seasonal allergic rhinitis 01/03/2019  . Proliferative diabetic retinopathy (HCC) 09/30/2018  . Human papillomavirus in conditions classified elsewhere and of unspecified site 09/30/2018  . Atypical squamous cell changes of cervix undetermined significance favor benign 09/30/2018  . Chronic constipation 08/14/2018  . Multiple sclerosis (HCC) 11/24/2016  . 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 Uncontrolled type 1 diabetes mellitus with diabetic retinopathy (HCC) 11/24/2016    Past Surgical History:  Procedure Laterality Date  . CERVIX LESION DESTRUCTION    . CESAREAN SECTION    . EYE SURGERY     x4    Prior to Admission medications   Medication Sig Start Date End Date Taking? Authorizing  Provider  Dimethyl Fumarate 240 MG CPDR Take 1 capsule by mouth in the morning and at bedtime.     [provider]  famotidine (PEPCID) 20 MG tablet Take 1 tablet (20 mg total) by mouth 2 (two) times daily. 05/27/19 10/12/19  Miguel Aschoff., MD  ibuprofen (ADVIL) 600 MG tablet Take 1 tablet (600 mg total) by mouth every 6 (six) hours as needed. 11/15/19   Enid Derry, PA-C  insulin aspart (NOVOLOG) 100 UNIT/ML injection Insulin pump 07/13/19   Concha Se, MD  insulin glargine (LANTUS) 100 UNIT/ML injection Inject 0.22 mLs (22 Units total) into the skin at bedtime. Patient not taking: Reported on 10/12/2019 01/16/17 02/15/17  Arrien, York Ram, MD  meloxicam (MOBIC) 15 MG tablet Take 15 mg by mouth daily. 09/27/19   [provider]  Needles & Syringes MISC Please give insulin syringes and needles. 01/16/17   Arrien, York Ram, MD  norelgestromin-ethinyl estradiol (ORTHO EVRA) 150-35 MCG/24HR transdermal patch Place 1 patch onto the skin once a week. 10/31/19   Hermina Staggers, MD  Norethindrone Acetate-Ethinyl Estrad-FE (LOESTRIN 24 FE) 1-20 MG-MCG(24) tablet Take 1 tablet by mouth daily. 11/12/19   Hermina Staggers, MD    Allergies Sulfamethoxazole-trimethoprim, Aspartame, Sulfa antibiotics, Trimethoprim, and Sweetness enhancer  No family history on file.  Social History Social History   Tobacco Use  . Smoking status: Never Smoker  . Smokeless tobacco: Never Used  Vaping Use  . Vaping Use: Never used  Substance Use Topics  . Alcohol use: Yes  . Drug use: No  Review of Systems  Cardiovascular: No chest pain. Respiratory: No SOB. Gastrointestinal: No nausea, no vomiting.  Musculoskeletal: Positive for wrist pain. Skin: Negative for rash, abrasions, lacerations, ecchymosis. Neurological: Negative for headaches, numbness or tingling   ____________________________________________   PHYSICAL EXAM:  VITAL SIGNS: ED Triage Vitals [11/15/19 1024]  Enc  Vitals Group     BP 131/84     Pulse Rate 82     Resp 20     Temp 98.3 F (36.8 C)     Temp Source Oral     SpO2 100 %     Weight 152 lb (68.9 kg)     Height 5\' 6"  (1.676 m)     Head Circumference      Peak Flow      Pain Score 6     Pain Loc      Pain Edu?      Excl. in [place]?      Constitutional: Alert and oriented. Well appearing and in no acute distress. Eyes: Conjunctivae are normal. PERRL. EOMI. Head: Atraumatic. ENT:      Ears:      Nose: No congestion/rhinnorhea.      Mouth/Throat: Mucous membranes are moist.  Neck: No stridor. Cardiovascular: Normal rate, regular rhythm.  Good peripheral circulation. Symmetric radial pulses bilaterally. Respiratory: Normal respiratory effort without tachypnea or retractions. Lungs CTAB. Good air entry to the bases with no decreased or absent breath sounds. Musculoskeletal: Full range of motion to all extremities. No gross deformities appreciated. Mild tenderness to palpation to dorsal wrist. Full ROM of wrist. No swelling. Neurologic:  Normal speech and language. No gross focal neurologic deficits are appreciated.  Skin:  Skin is warm, dry. Shave abrasion to left knuckles. Bandage in place. Psychiatric: Mood and affect are normal. Speech and behavior are normal. Patient exhibits appropriate insight and judgement.   ____________________________________________   LABS (all labs ordered are listed, but only abnormal results are displayed)  Labs Reviewed - No data to display ____________________________________________  EKG   ____________________________________________  RADIOLOGY Robinette Haines, personally viewed and evaluated these images (plain radiographs) as part of my medical decision making, as well as reviewing the written report by the radiologist.  DG Wrist Complete Right  Result Date: 11/15/2019 CLINICAL DATA:  Pain following fall EXAM: RIGHT WRIST - COMPLETE 3+ VIEW COMPARISON:  None. FINDINGS: Frontal, oblique,  lateral, and ulnar deviation scaphoid images were obtained. No fracture or dislocation. Joint spaces appear normal. No erosive change. IMPRESSION: No fracture or dislocation.  No evident arthropathy. Electronically Signed   By: Lowella Grip III M.D.   On: 11/15/2019 11:17    ____________________________________________    PROCEDURES  Procedure(s) performed:    Procedures    Medications  ketorolac (TORADOL) 30 MG/ML injection 30 mg (30 mg Intramuscular Given 11/15/19 1159)  Tdap (BOOSTRIX) injection 0.5 mL (0.5 mLs Intramuscular Given 11/15/19 1157)     ____________________________________________   INITIAL IMPRESSION / ASSESSMENT AND PLAN / ED COURSE  Pertinent labs & imaging results that were available during my care of the patient were reviewed by me and considered in my medical decision making (see chart for details).  Review of the [employer] CSRS was performed in accordance of the [place] prior to dispensing any controlled drugs.   Patient presented to the emergency department for evaluation of wrist pain after fall yesterday. Vital signs and exam are reassuring. Xray negative for acute abnormalities. Tetanus shot was updated. Patient will be discharged home with prescriptions for motrin.  Patient is to follow up with PCP as directed. Patient is given ED precautions to return to the ED for any worsening or new symptoms.   Selen Lambson was evaluated in Emergency Department on 11/15/2019 for the symptoms described in the history of present illness. She was evaluated in the context of the global COVID-19 pandemic, which necessitated consideration that the patient might be at risk for infection with the SARS-CoV-2 virus that causes COVID-19. Institutional protocols and algorithms that pertain to the evaluation of patients at risk for COVID-19 are in a state of rapid change based on information released by regulatory bodies including the CDC and federal and state organizations. These  policies and algorithms were followed during the patient's care in the ED.  ____________________________________________  FINAL CLINICAL IMPRESSION(S) / ED DIAGNOSES  Final diagnoses:  Injury of right wrist, initial encounter      NEW MEDICATIONS STARTED DURING THIS VISIT:  ED Discharge Orders         Ordered    ibuprofen (ADVIL) 600 MG tablet  Every 6 hours PRN     Discontinue  Reprint     11/15/19 1152              This chart was dictated using voice recognition software/Dragon. Despite best efforts to proofread, errors can occur which can change the meaning. Any change was purely unintentional.    Enid Derry, PA-C 11/15/19 1316    Minna Antis, MD 11/15/19 1433

## 2019-11-15 NOTE — ED Triage Notes (Signed)
Pt presents to ED via POV with c/o R wrist pain, pt reports fell yesterday, now c/o R wrist pain with noted swelling at this time.

## 2019-11-24 ENCOUNTER — Telehealth: Payer: Self-pay | Admitting: General Practice

## 2019-11-24 NOTE — Telephone Encounter (Signed)
Patient called and left message on nurse voicemail line stating she left a message a couple weeks ago about her pelvic pain. Patient states she was told to take pills and that would help her pain. Patient states she is still having pain. Patient reports she has been taking the pills for a week now. Advised patient to take ibuprofen prescription in the meantime but discussed she needs an follow up appt with Dr Alysia Penna. Patient verbalized understanding & will await return call with an appt.

## 2019-12-11 ENCOUNTER — Other Ambulatory Visit: Payer: Self-pay

## 2019-12-11 ENCOUNTER — Ambulatory Visit: Payer: Medicaid Other | Admitting: Podiatry

## 2019-12-11 ENCOUNTER — Ambulatory Visit: Payer: Managed Care, Other (non HMO) | Admitting: Podiatry

## 2019-12-11 ENCOUNTER — Encounter: Payer: Self-pay | Admitting: Podiatry

## 2019-12-11 DIAGNOSIS — M79674 Pain in right toe(s): Secondary | ICD-10-CM | POA: Diagnosis not present

## 2019-12-11 DIAGNOSIS — E109 Type 1 diabetes mellitus without complications: Secondary | ICD-10-CM

## 2019-12-11 DIAGNOSIS — M79675 Pain in left toe(s): Secondary | ICD-10-CM

## 2019-12-11 DIAGNOSIS — B351 Tinea unguium: Secondary | ICD-10-CM

## 2019-12-11 NOTE — Progress Notes (Signed)
This patient presents to the office with chief complaint of long thick nails and diabetic feet.  This patient  says there  is  no pain and discomfort in her  feet.  This patient says there are long thick painful nails.  These nails are painful walking and wearing shoes.  Patient has no history of infection or drainage from both feet.  Patient is unable to  self treat his own nails . This patient presents  to the office today for treatment of the  long nails and a foot evaluation due to history of  Diabetes. Patient has type 1 DM. Patient was last seen 9 months ago.  General Appearance  Alert, conversant and in no acute stress.  Vascular  Dorsalis pedis and posterior tibial  pulses are palpable  bilaterally.  Capillary return is within normal limits  bilaterally. Temperature is within normal limits  bilaterally.  Neurologic  Senn-Weinstein monofilament wire test within normal limits  bilaterally. Muscle power within normal limits bilaterally.  Nails Thick disfigured discolored nails with subungual debris  hallux nails  bilaterally. No evidence of bacterial infection or drainage bilaterally.  Orthopedic  No limitations of motion of motion feet .  No crepitus or effusions noted.  No bony pathology or digital deformities noted.  Skin  normotropic skin with no porokeratosis noted bilaterally.  No signs of infections or ulcers noted.     Onychomycosis  Diabetes with no foot complications  Debridement of thick long nails with nail nipper and dremel tool.    RTC prn.   Helane Gunther DPM

## 2019-12-22 ENCOUNTER — Telehealth: Payer: Self-pay | Admitting: *Deleted

## 2019-12-22 NOTE — Telephone Encounter (Signed)
Pt left VM message stating that she switched birth control as directed. She has not had a period since 6/8 and wants to know if this is normal. She checked a home pregnancy test and it was negative. Her next appt w/Dr. Alysia Penna is 8/2. Please call back.

## 2019-12-25 NOTE — Telephone Encounter (Signed)
Patient's concern addressed in different encounter 

## 2020-01-05 ENCOUNTER — Other Ambulatory Visit: Payer: Self-pay

## 2020-01-05 ENCOUNTER — Ambulatory Visit (INDEPENDENT_AMBULATORY_CARE_PROVIDER_SITE_OTHER): Payer: Medicaid Other | Admitting: Obstetrics and Gynecology

## 2020-01-05 ENCOUNTER — Encounter: Payer: Self-pay | Admitting: Obstetrics and Gynecology

## 2020-01-05 DIAGNOSIS — M7918 Myalgia, other site: Secondary | ICD-10-CM

## 2020-01-05 DIAGNOSIS — M791 Myalgia, unspecified site: Secondary | ICD-10-CM | POA: Insufficient documentation

## 2020-01-05 NOTE — Patient Instructions (Signed)

## 2020-01-05 NOTE — Progress Notes (Signed)
Pt states that the Pills helped with pain but is having a weird feeling in her right breast, like moving liquid?

## 2020-01-05 NOTE — Progress Notes (Signed)
Patient ID: Jeanne Mosley, female   DOB: [birth year], 34 y.o.   MRN: [id number] Ms Peppers presents with c/o pulling sensation above her right breast that radiates down to the breast. Started this past Friday. Intermittent. She denies any injury or trauma. Works for [employer] in [place]. No FH of breast cancer. No nipple discharge. Does drink caffeine/energy drinks d/t to third shift  She is not taking OCP's x 1 month now. Cycles have been monthly. LMP started Sunday. No more pelvic pain. Not sexual active presently.  PE AF VSS Lungs clear Heart RRR Breast sym supple no nipple discharge or adenopathy. Able to reproduce discomfort with palpitation of chest wall above breast tissue.  A/P Muscle strain/pain  Pt reassured. Sx treatment for pain. Encouraged to decrease caffeine use. F/U PRN

## 2020-02-08 ENCOUNTER — Emergency Department
Admission: EM | Admit: 2020-02-08 | Discharge: 2020-02-08 | Disposition: A | Discharge status: Home / Self Care | Payer: Medicaid Other | Attending: Emergency Medicine | Admitting: Emergency Medicine

## 2020-02-08 ENCOUNTER — Other Ambulatory Visit: Payer: Self-pay

## 2020-02-08 DIAGNOSIS — Y998 Other external cause status: Secondary | ICD-10-CM | POA: Insufficient documentation

## 2020-02-08 DIAGNOSIS — H5711 Ocular pain, right eye: Secondary | ICD-10-CM | POA: Diagnosis present

## 2020-02-08 DIAGNOSIS — Z794 Long term (current) use of insulin: Secondary | ICD-10-CM | POA: Insufficient documentation

## 2020-02-08 DIAGNOSIS — Y9289 Other specified places as the place of occurrence of the external cause: Secondary | ICD-10-CM | POA: Diagnosis not present

## 2020-02-08 DIAGNOSIS — Y9389 Activity, other specified: Secondary | ICD-10-CM | POA: Diagnosis not present

## 2020-02-08 DIAGNOSIS — E10319 Type 1 diabetes mellitus with unspecified diabetic retinopathy without macular edema: Secondary | ICD-10-CM | POA: Diagnosis not present

## 2020-02-08 DIAGNOSIS — X58XXXA Exposure to other specified factors, initial encounter: Secondary | ICD-10-CM | POA: Insufficient documentation

## 2020-02-08 DIAGNOSIS — S0501XA Injury of conjunctiva and corneal abrasion without foreign body, right eye, initial encounter: Secondary | ICD-10-CM | POA: Diagnosis not present

## 2020-02-08 MED ORDER — CIPROFLOXACIN HCL 0.3 % OP SOLN: 1.0000 [drp] | Freq: Four times a day (QID) | OPHTHALMIC | 0 refills | Status: DC

## 2020-02-08 MED ORDER — TETRACAINE HCL 0.5 % OP SOLN
2.0000 [drp] | Freq: Once | OPHTHALMIC | Status: DC
  Filled 2020-02-08 (×2): qty 4

## 2020-02-08 MED ORDER — CIPROFLOXACIN HCL 0.3 % OP SOLN
2.0000 [drp] | Freq: Once | OPHTHALMIC | Status: AC
  Administered 2020-02-08: 2 [drp] via OPHTHALMIC
  Filled 2020-02-08: qty 2.5

## 2020-02-08 MED ORDER — KETOROLAC TROMETHAMINE 0.5 % OP SOLN: 1.0000 [drp] | Freq: Four times a day (QID) | OPHTHALMIC | 0 refills | Status: DC

## 2020-02-08 MED ORDER — FLUORESCEIN SODIUM 1 MG OP STRP
ORAL_STRIP | OPHTHALMIC | Status: AC
  Filled 2020-02-08: qty 1

## 2020-02-08 MED ORDER — FLUORESCEIN SODIUM 1 MG OP STRP
1.0000 | ORAL_STRIP | Freq: Once | OPHTHALMIC | Status: DC
  Filled 2020-02-08: qty 1

## 2020-02-08 NOTE — ED Triage Notes (Signed)
Patient reports she was looking at cards that came with her wigs and when she pulled it off the wig it hit her in the eye.  Patient reports right eye pain since.

## 2020-02-09 NOTE — ED Provider Notes (Signed)
[facility] Emergency Department Provider Note  ____________________________________________   First MD Initiated Contact with Patient 02/08/20 2029     (approximate)  I have reviewed the triage vital signs and the nursing notes.   HISTORY  Chief Complaint Eye Pain  HPI Jeanne Mosley is a 34 y.o. female who reports to the emergency department for evaluation of right eye pain.  Patient states that yesterday, approximately 24 hours ago, she was opening a new leg out of a package to try it on.  When she went to go flip the leg on there was a set of instructions inside of the way that came flying out and hit her in the right thigh.  The patient has had pain since that time.  She has had increased tearing of the eye.  She denies any change in her vision, but states that she has not been wearing her glasses.  She is not a contact lens wearer.  She states that nothing makes the pain better or worse.         Past Medical History:  Diagnosis Date  . Cervical dysplasia   . Depression   . DM (diabetes mellitus), type 1 (HCC)   . MS (multiple sclerosis) (HCC)   . UTI (urinary tract infection)     Patient Active Problem List   Diagnosis Date Noted  . Muscle pain 01/05/2020  . Depression 02/19/2019  . Pain due to onychomycosis of toenails of both feet 02/05/2019  . Diabetes mellitus type I (HCC) 02/05/2019  . Seasonal allergic rhinitis 01/03/2019  . Proliferative diabetic retinopathy (HCC) 09/30/2018  . Human papillomavirus in conditions classified elsewhere and of unspecified site 09/30/2018  . Atypical squamous cell changes of cervix undetermined significance favor benign 09/30/2018  . Chronic constipation 08/14/2018  . Multiple sclerosis (HCC) 11/24/2016  . 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 Uncontrolled type 1 diabetes mellitus with diabetic retinopathy (HCC) 11/24/2016    Past  Surgical History:  Procedure Laterality Date  . CERVIX LESION DESTRUCTION    . CESAREAN SECTION    . EYE SURGERY     x4    Prior to Admission medications   Medication Sig Start Date End Date Taking? Authorizing Provider  ciprofloxacin (CILOXAN) 0.3 % ophthalmic solution Place 1 drop into the right eye in the morning, at noon, in the evening, and at bedtime for 5 days. Administer 1 drop, every 2 hours, while awake, for 2 days. Then 1 drop, every 4 hours, while awake, for the next 5 days. 02/08/20 02/13/20  Lucy Chris, PA  Dimethyl Fumarate 240 MG CPDR Take 1 capsule by mouth in the morning and at bedtime.     [provider]  famotidine (PEPCID) 20 MG tablet Take 1 tablet (20 mg total) by mouth 2 (two) times daily. 05/27/19 10/12/19  Miguel Aschoff., MD  ibuprofen (ADVIL) 600 MG tablet Take 1 tablet (600 mg total) by mouth every 6 (six) hours as needed. 11/15/19   Enid Derry, PA-C  insulin aspart (NOVOLOG) 100 UNIT/ML injection Insulin pump 07/13/19   Concha Se, MD  insulin glargine (LANTUS) 100 UNIT/ML injection Inject 0.22 mLs (22 Units total) into the skin at bedtime. Patient not taking: Reported on 10/12/2019 01/16/17 02/15/17  Arrien, York Ram, MD  ketorolac (ACULAR) 0.5 % ophthalmic solution Place 1 drop into the right eye 4 (four) times daily. 02/08/20   Lucy Chris, PA  meloxicam (MOBIC) 15  MG tablet Take 15 mg by mouth daily. 09/27/19   [provider]  Needles & Syringes MISC Please give insulin syringes and needles. 01/16/17   Arrien, York Ram, MD  norelgestromin-ethinyl estradiol (ORTHO EVRA) 150-35 MCG/24HR transdermal patch Place 1 patch onto the skin once a week. 10/31/19   Hermina Staggers, MD  Norethindrone Acetate-Ethinyl Estrad-FE (LOESTRIN 24 FE) 1-20 MG-MCG(24) tablet Take 1 tablet by mouth daily. 11/12/19   Hermina Staggers, MD    Allergies Sulfamethoxazole-trimethoprim, Aspartame, Trimethoprim, Sulfa antibiotics, and Sweetness  enhancer  No family history on file.  Social History Social History   Tobacco Use  . Smoking status: Never Smoker  . Smokeless tobacco: Never Used  Vaping Use  . Vaping Use: Never used  Substance Use Topics  . Alcohol use: Yes  . Drug use: No    Review of Systems Constitutional: No fever/chills Eyes: + Right eye pain ENT: No sore throat. Cardiovascular: Denies chest pain. Respiratory: Denies shortness of breath. Gastrointestinal: No abdominal pain.  No nausea, no vomiting.  No diarrhea.  No constipation. Genitourinary: Negative for dysuria. Musculoskeletal: Negative for back pain. Skin: Negative for rash. Neurological: Negative for headaches, focal weakness or numbness.   ____________________________________________   PHYSICAL EXAM:  VITAL SIGNS: ED Triage Vitals [02/08/20 1910]  Enc Vitals Group     BP 132/81     Pulse Rate 80     Resp 16     Temp 98.4 F (36.9 C)     Temp Source Oral     SpO2 100 %     Weight 156 lb (70.8 kg)     Height 5\' 2"  (1.575 m)     Head Circumference      Peak Flow      Pain Score 8     Pain Loc      Pain Edu?      Excl. in [place]?     Constitutional: Alert and oriented. Well appearing and in no acute distress. Eyes: Conjunctivae of the left eye is normal.  Conjunctival of the right eye is injected.  EOMI.  PERRLA.  Examination of the eye with fluorescein staining reveals a small corneal abrasion on the lateral aspect of the cornea.  Is approximately 1 to 2 mm in size.  There is no foreign body identified.  Palpation of the periorbital space is nontender. Head: Atraumatic. Nose: No congestion/rhinnorhea. Mouth/Throat: Mucous membranes are moist.  Oropharynx non-erythematous. Neck: No stridor.   Cardiovascular: Normal rate, regular rhythm. Grossly normal heart sounds.  Good peripheral circulation. Respiratory: Normal respiratory effort.   Gastrointestinal: Soft and nontender. No distention.  Musculoskeletal: No lower extremity  tenderness nor edema.  No joint effusions. Neurologic:  Normal speech and language. No gross focal neurologic deficits are appreciated.  Skin:  Skin is warm, dry and intact. No rash noted. Psychiatric: Mood and affect are normal. Speech and behavior are normal.  ____________________________________________   INITIAL IMPRESSION / ASSESSMENT AND PLAN / ED COURSE  As part of my medical decision making, I reviewed the following data within the electronic MEDICAL RECORD NUMBER Nursing notes reviewed and incorporated        Jeanne Mosley is a 34 year old female who presents to the emergency department for evaluation of her right hand.  This began as a traumatic incident 1 day ago when a piece of paper from a big flu at her eye.  She has had pain since that time.  Physical exam reveals an injected conjunctive a as well  as a corneal abrasion that is small in nature seen under fluorescein staining.  Patient will be treated with an antibiotic as well as Acular for inflammation.  The patient is amenable with this plan.  She was also advised she should follow-up with opthalmology if she has increasing pain or concerns.  Jeanne Mosley was evaluated in Emergency Department on 02/09/2020 for the symptoms described in the history of present illness. She was evaluated in the context of the global COVID-19 pandemic, which necessitated consideration that the patient might be at risk for infection with the SARS-CoV-2 virus that causes COVID-19. Institutional protocols and algorithms that pertain to the evaluation of patients at risk for COVID-19 are in a state of rapid change based on information released by regulatory bodies including the CDC and federal and state organizations. These policies and algorithms were followed during the patient's care in the ED.      ____________________________________________   FINAL CLINICAL IMPRESSION(S) / ED DIAGNOSES  Final diagnoses:  Abrasion of right cornea, initial  encounter     ED Discharge Orders         Ordered    ciprofloxacin (CILOXAN) 0.3 % ophthalmic solution  4 times daily        02/08/20 2220    ketorolac (ACULAR) 0.5 % ophthalmic solution  4 times daily        02/08/20 2220           Note:  This document was prepared using Dragon voice recognition software and may include unintentional dictation errors.    Lucy Chris, PA 02/09/20 3546    Delton Prairie, MD 02/10/20 2104

## 2020-02-11 ENCOUNTER — Telehealth: Payer: Self-pay | Admitting: Emergency Medicine

## 2020-02-11 MED ORDER — KETOROLAC TROMETHAMINE 0.5 % OP SOLN
1.0000 [drp] | Freq: Four times a day (QID) | OPHTHALMIC | 0 refills | Status: DC
Start: 2020-02-11 — End: 2021-06-30

## 2020-02-11 MED ORDER — CIPROFLOXACIN HCL 0.3 % OP SOLN
OPHTHALMIC | 0 refills | Status: DC
Start: 2020-02-11 — End: 2021-06-30

## 2020-02-11 NOTE — Telephone Encounter (Signed)
Received call regarding a problem with the pt's prescriptions from recent ED visit. Chart reviewed, plan of care is reasonable to me, so I will resubmit prescriptions.

## 2020-03-13 IMAGING — US US ABDOMEN LIMITED
1 series · 14 of 25 positions shown · non-contrast
Comparison: None.

CLINICAL DATA: Upper abdominal pain

EXAM:
ULTRASOUND ABDOMEN LIMITED RIGHT UPPER QUADRANT

[Series 1: us abdomen limited · 14 of 39 slices shown]
[im 1/39]
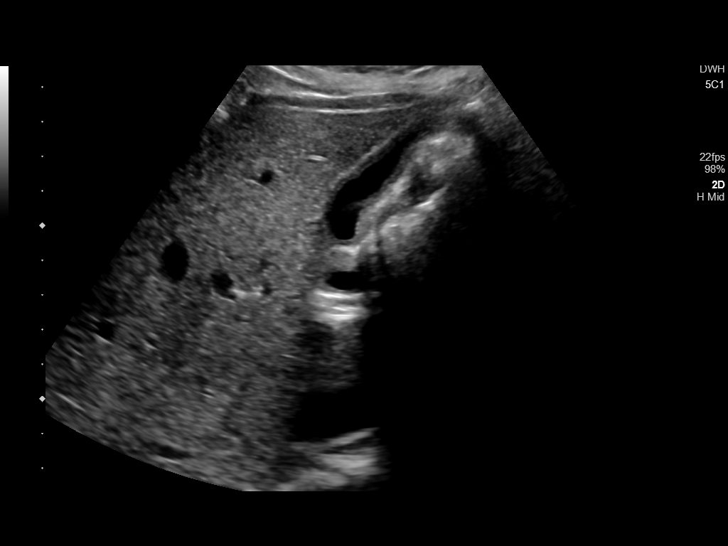
[im 4/39]
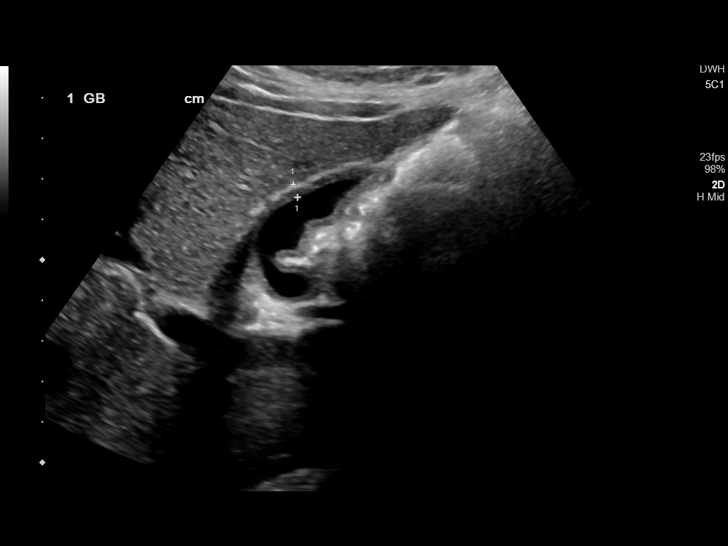
[im 7/39]
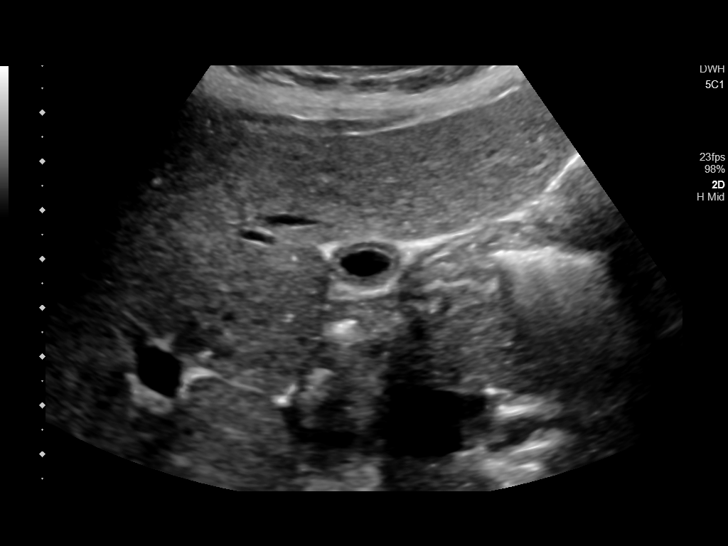
[im 10/39]
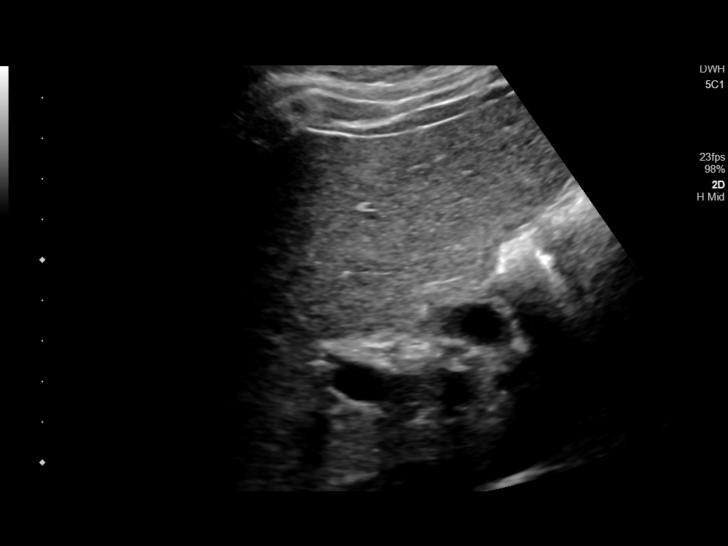
[im 13/39]
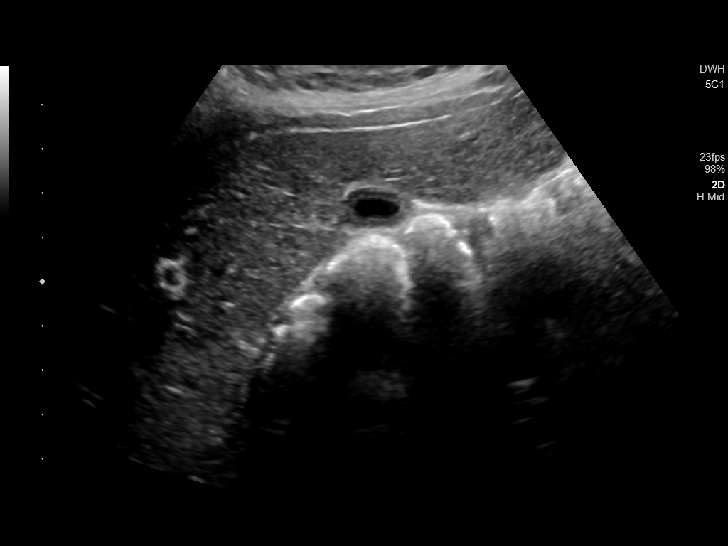
[im 15/39]
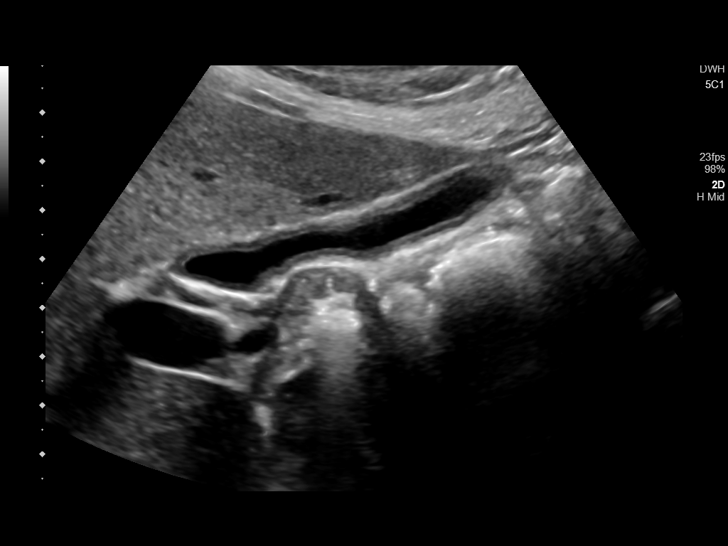
[im 18/39]
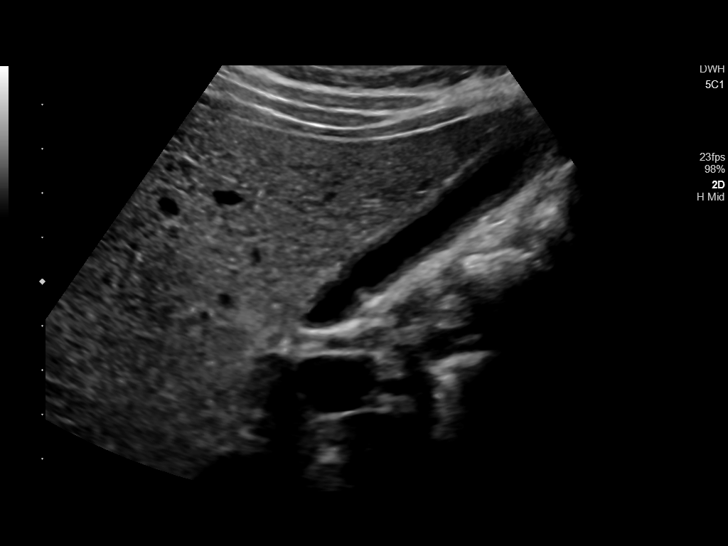
[im 21/39]
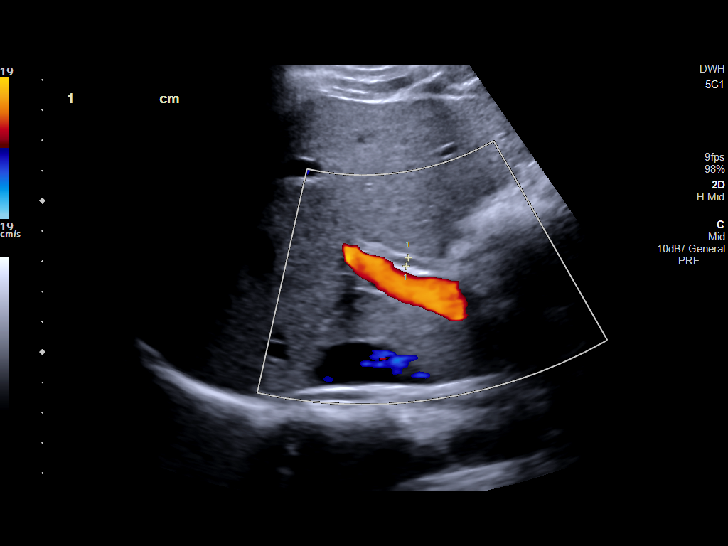
[im 24/39]
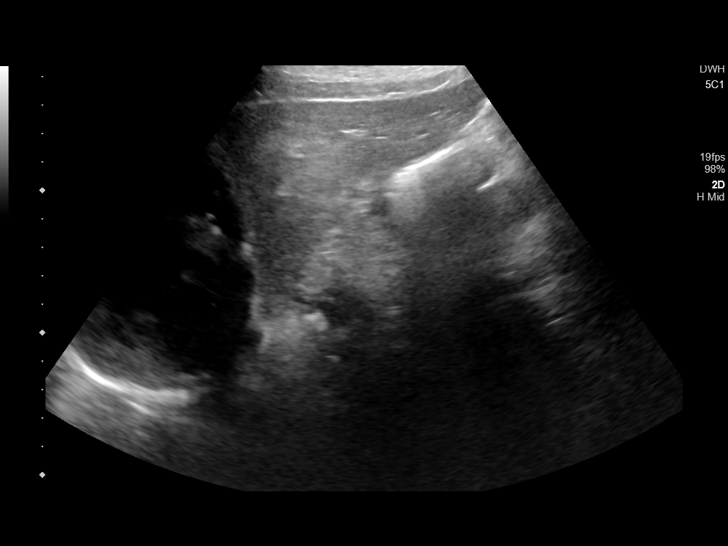
[im 26/39]
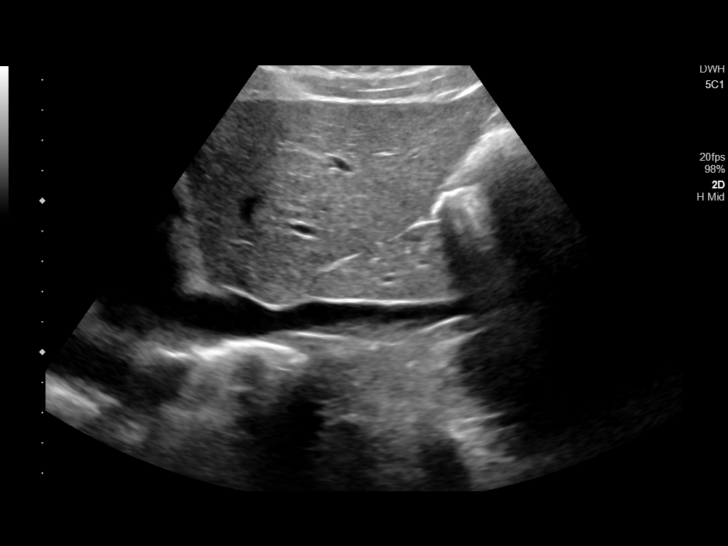
[im 29/39]
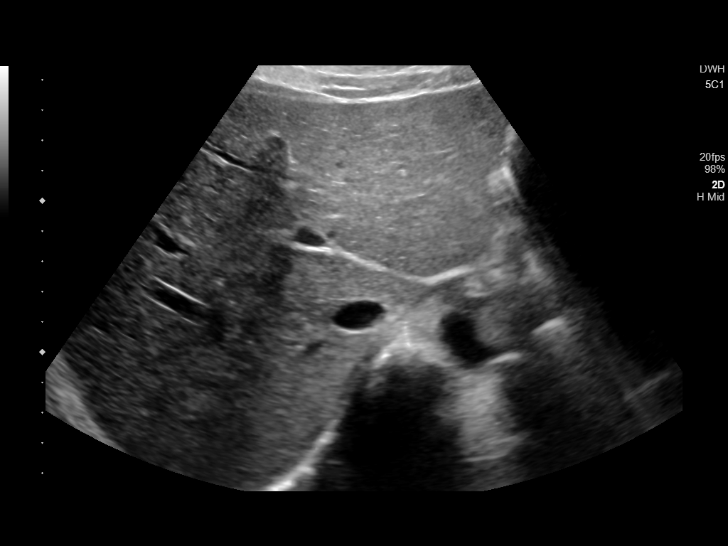
[im 32/39]
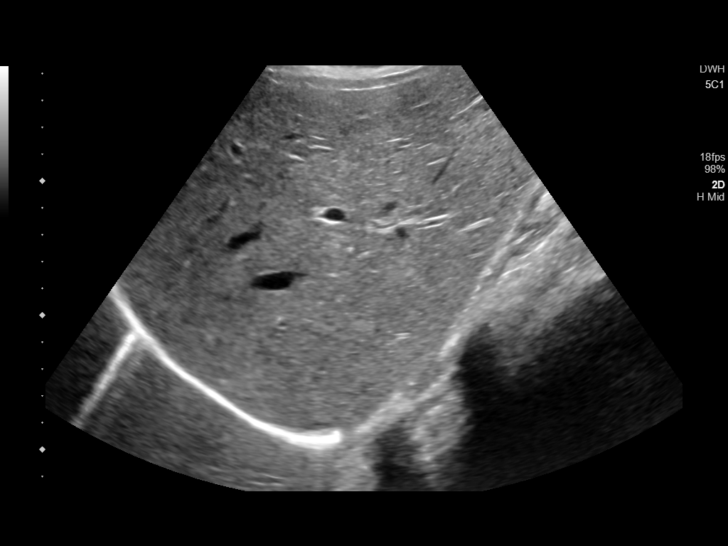
[im 35/39]
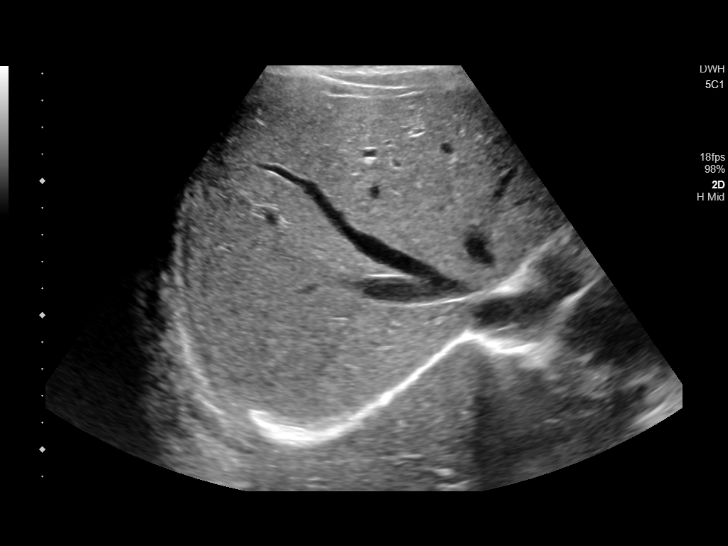
[im 39/39]
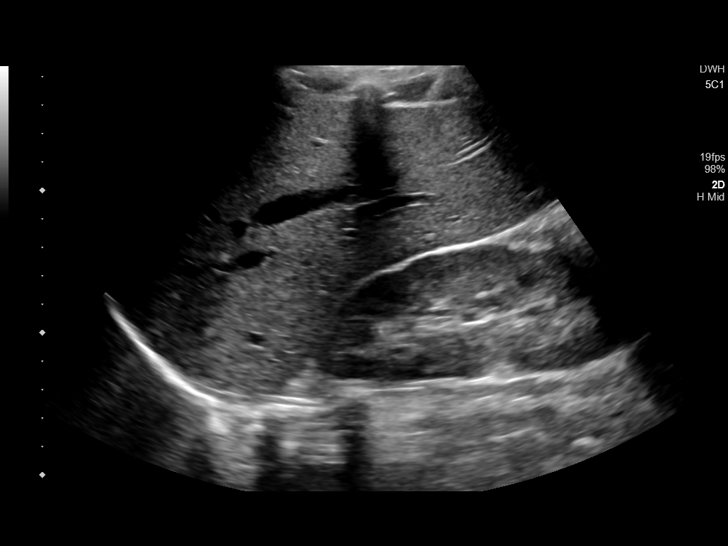

[14 of 25 positions shown; findings below may reference images not displayed]

FINDINGS: Gallbladder:

Gallbladder is partially contracted. No gallstones or wall
thickening visualized. No sonographic Murphy sign noted by
sonographer.

Common bile duct:

Diameter: 2.9 mm

Liver:

No focal lesion identified. Within normal limits in parenchymal
echogenicity. Portal vein is patent on color Doppler imaging with
normal direction of blood flow towards the liver.

Other: None.
IMPRESSION: Partially contracted gallbladder. Otherwise normal right upper
quadrant ultrasound.

## 2020-04-01 ENCOUNTER — Ambulatory Visit: Payer: Medicaid Other | Admitting: Podiatry

## 2020-07-29 IMAGING — US US PELVIS COMPLETE
1 series · 13 of 25 positions shown · non-contrast
Comparison: Prior CT from earlier the same day.

CLINICAL DATA: Initial evaluation for acute pelvic pain.

EXAM:
TRANSABDOMINAL ULTRASOUND OF PELVIS
DOPPLER ULTRASOUND OF OVARIES
TECHNIQUE: Transabdominal ultrasound examination of the pelvis was performed
including evaluation of the uterus, ovaries, adnexal regions, and
pelvic cul-de-sac.
Color and duplex Doppler ultrasound was utilized to evaluate blood
flow to the ovaries.

[Series 1: us pelvic complete w transvaginal and torsion righ · 66 acquisitions, 13 frames shown]
[im 1/66]
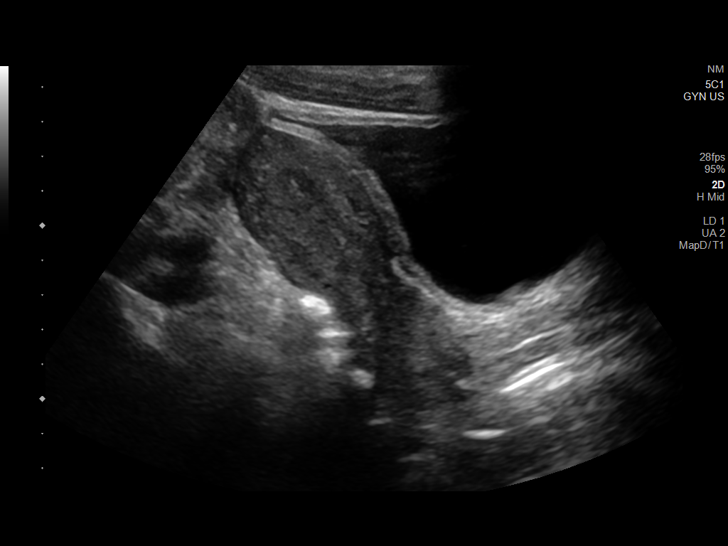
[im 6/66]
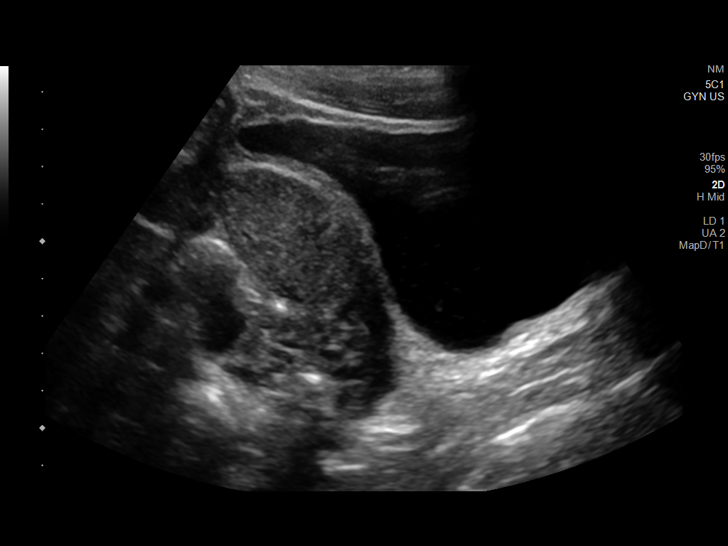
[im 11/66]
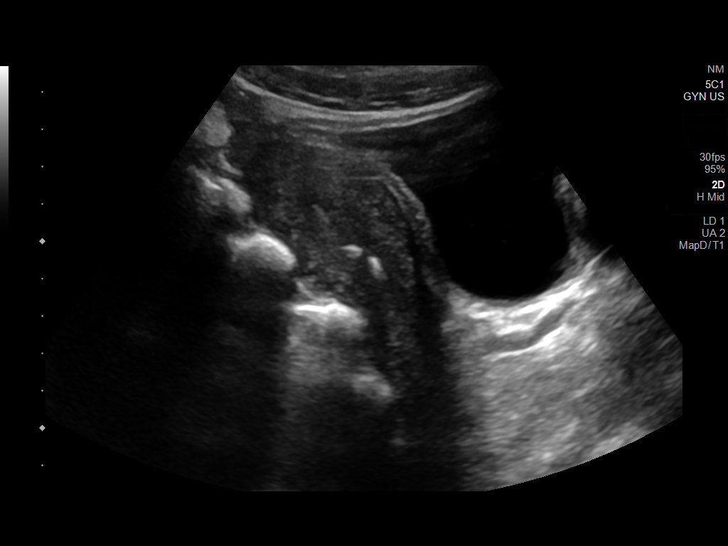
[im 17/66]
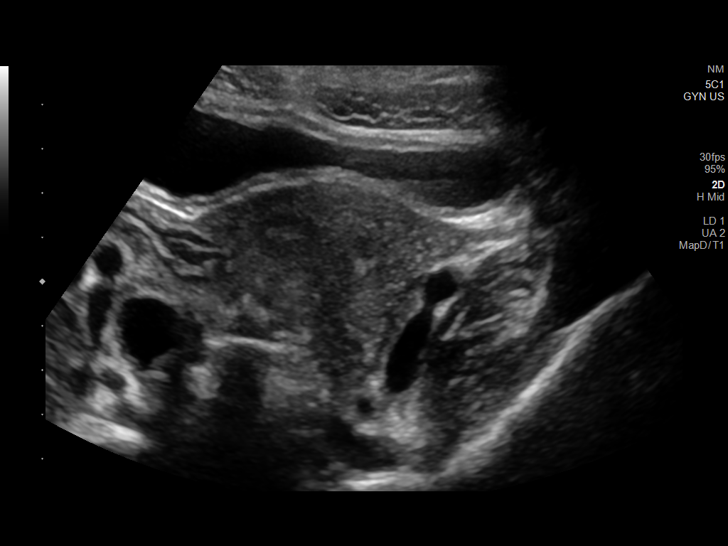
[im 22/66]
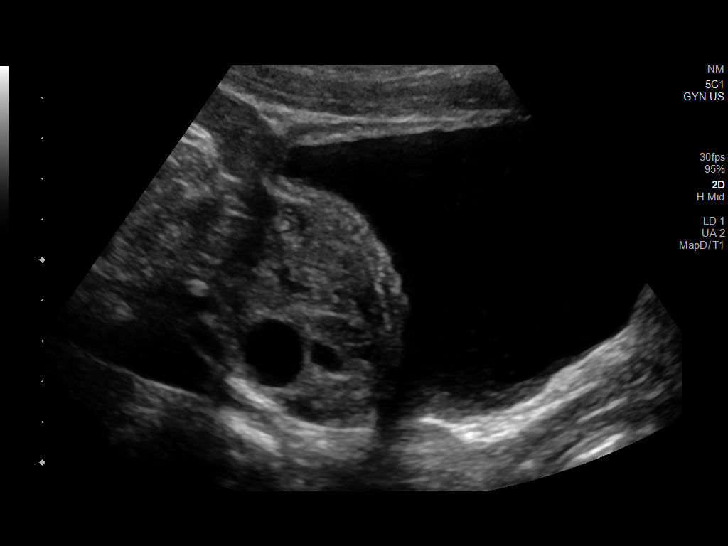
[im 28/66]
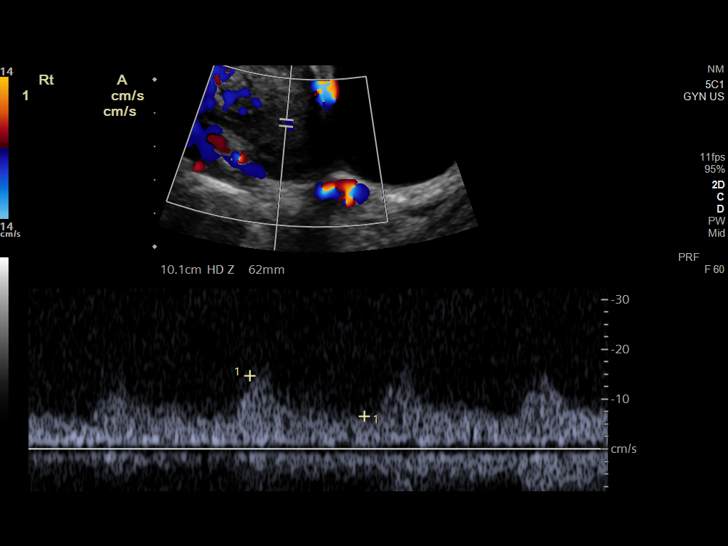
[im 33/66]
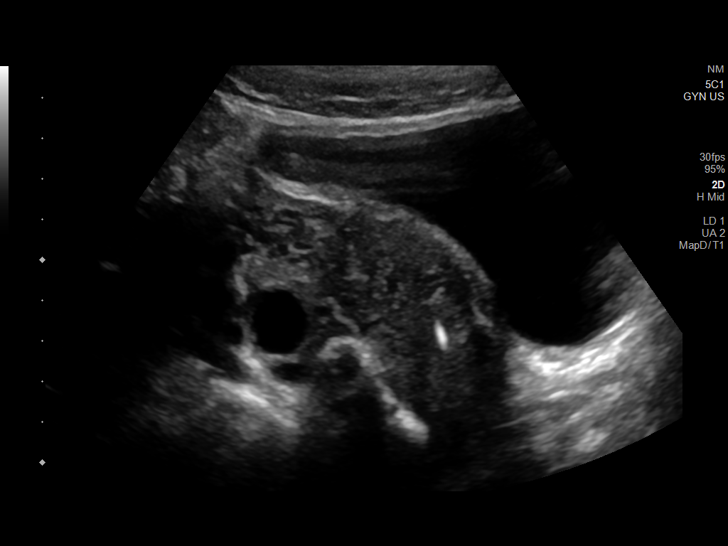
[im 38/66]
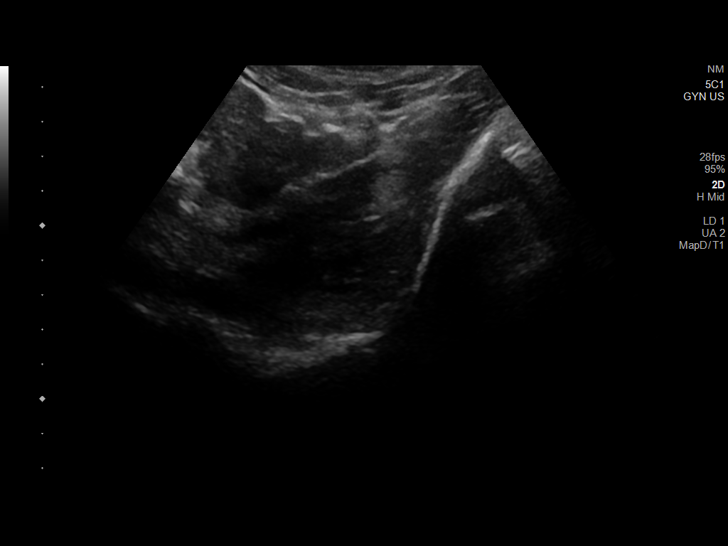
[im 44/66]
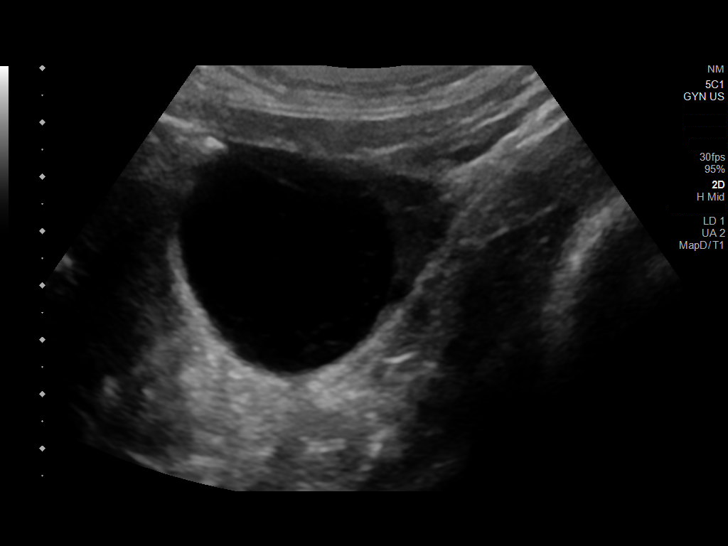
[im 49/66]
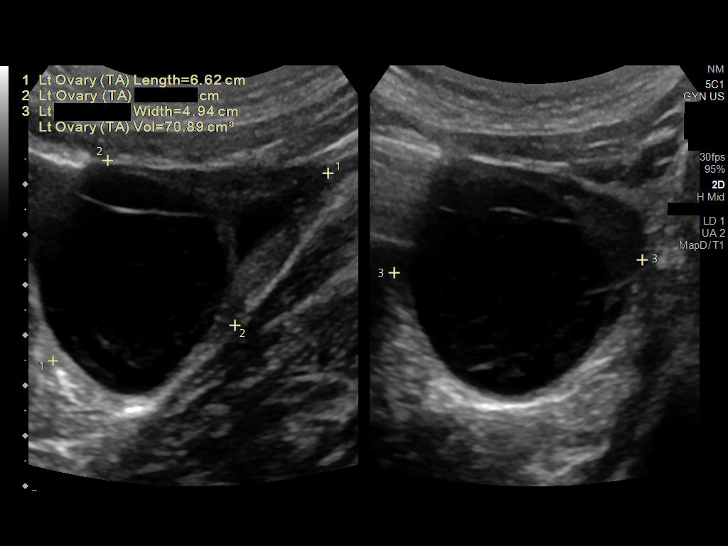
[im 55/66]
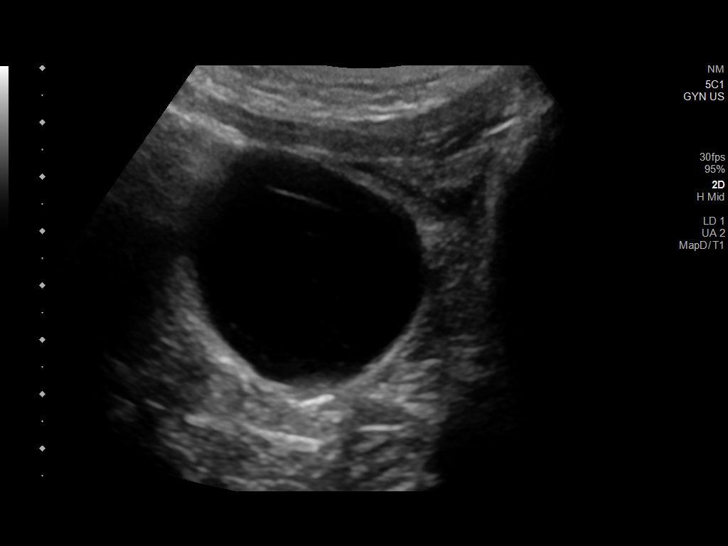
[im 60/66]
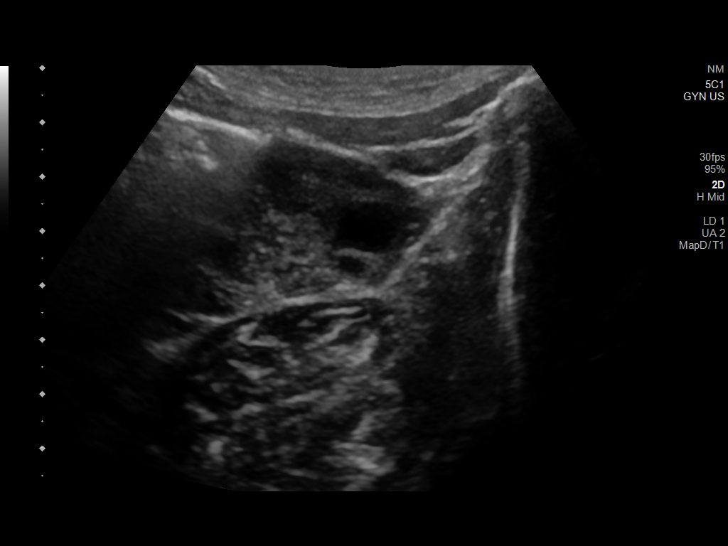
[im 66/66]
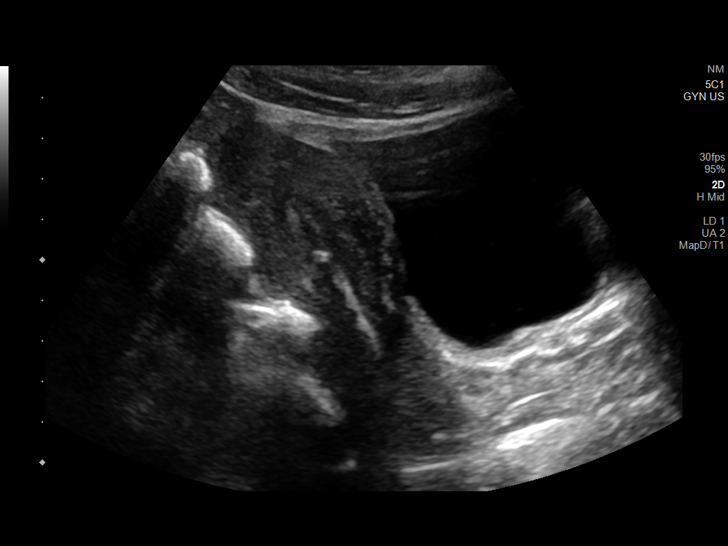

[13 of 25 positions shown; findings below may reference images not displayed]

FINDINGS: Uterus

Measurements: 9.6 x 3.4 x 4.4 cm = volume: 73.1 mL. No fibroids or
other mass visualized.

Endometrium

Thickness: 6.8 mm. No focal abnormality visualized. IUD in
appropriate position within the endometrial cavity.

Right ovary

Measurements: 4.0 x 2.4 x 3.2 cm = volume: 15.9 mL. 1.6 x 1.7 x
cm dominant follicle noted. No other adnexal mass.

Left ovary

Measurements: 6.6 x 4.2 x 5.0 cm = volume: 70.1 mL. 4.0 x 4.5 x
cm complex cyst with internal lace-like architecture, most
consistent with a benign hemorrhagic cyst. No internal solid
component or vascularity.

Pulsed Doppler evaluation demonstrates normal low-resistance
arterial and venous waveforms in both ovaries.

Other: No free fluid.
IMPRESSION: 1. 4.5 cm complex left ovarian cyst, most consistent with a benign
hemorrhagic cyst.
2. No evidence for ovarian torsion or other acute abnormality.
3. IUD in appropriate position within the endometrial cavity.

## 2020-09-01 IMAGING — CR DG WRIST COMPLETE 3+V*R*
1 series · 4 of 4 positions shown · non-contrast
Comparison: None.

CLINICAL DATA: Pain following fall

EXAM:
RIGHT WRIST - COMPLETE 3+ VIEW

[Series 1: dg wrist complete right · 0.14mm/px · 4 of 4 slices shown]
[im 1/4]
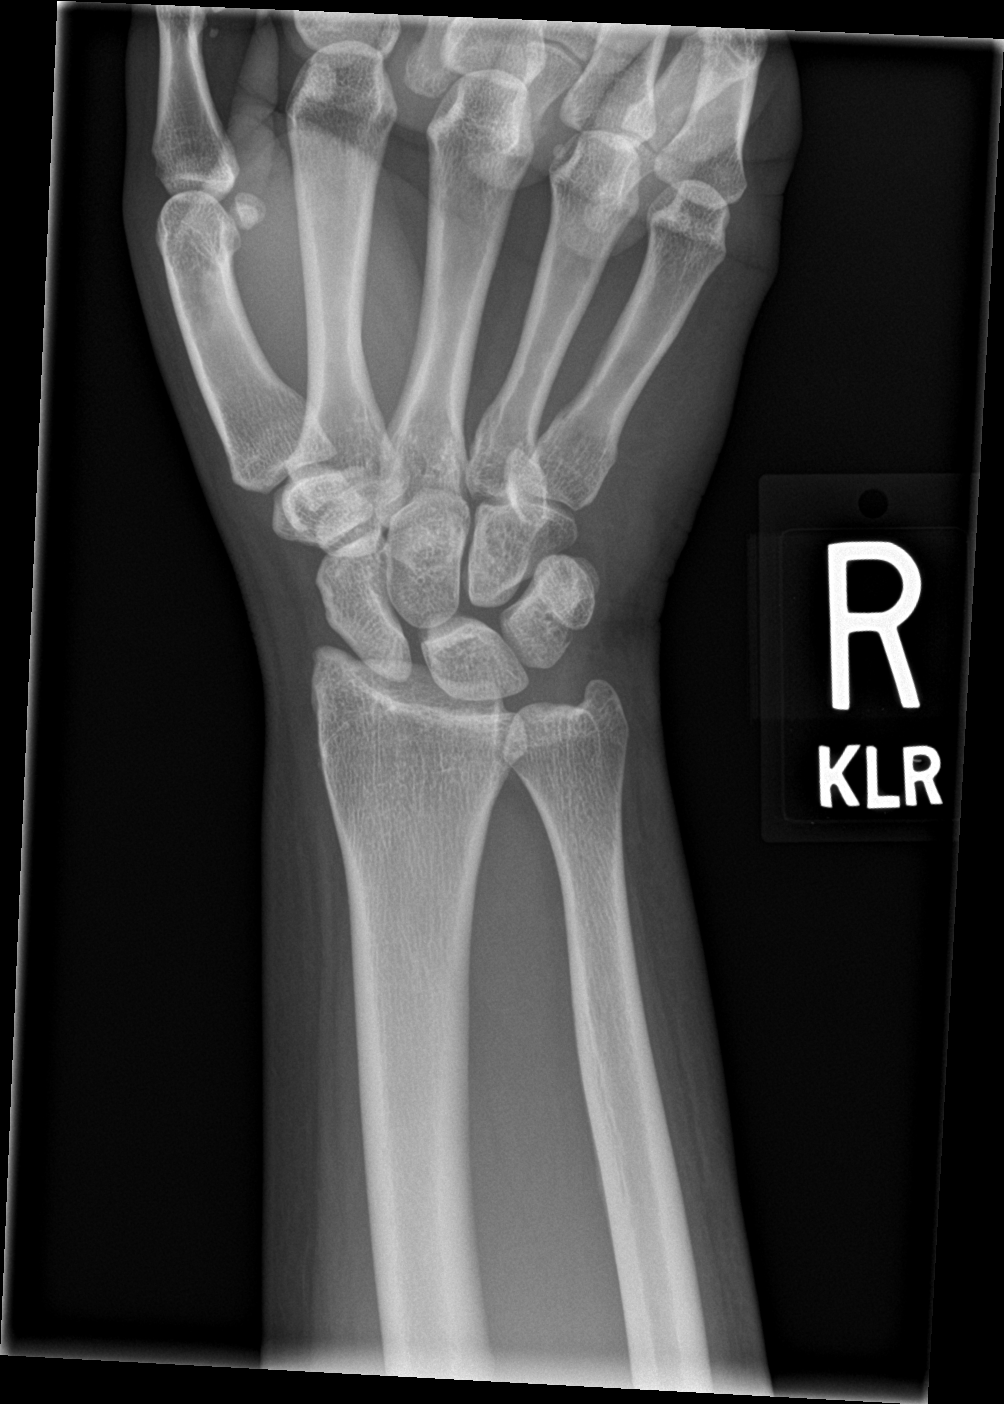
[im 2/4]
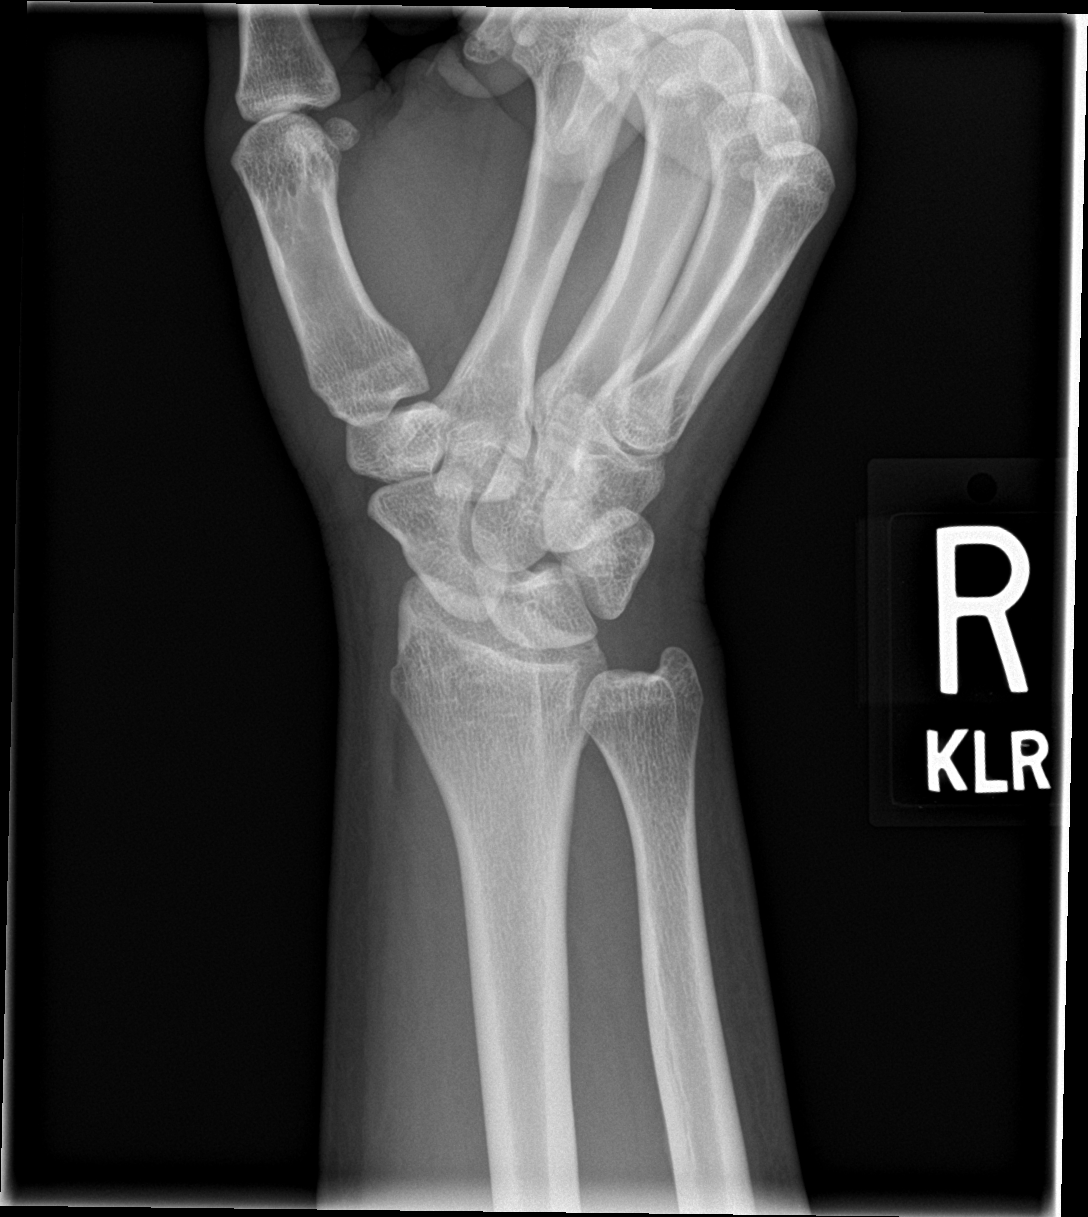
[im 3/4]
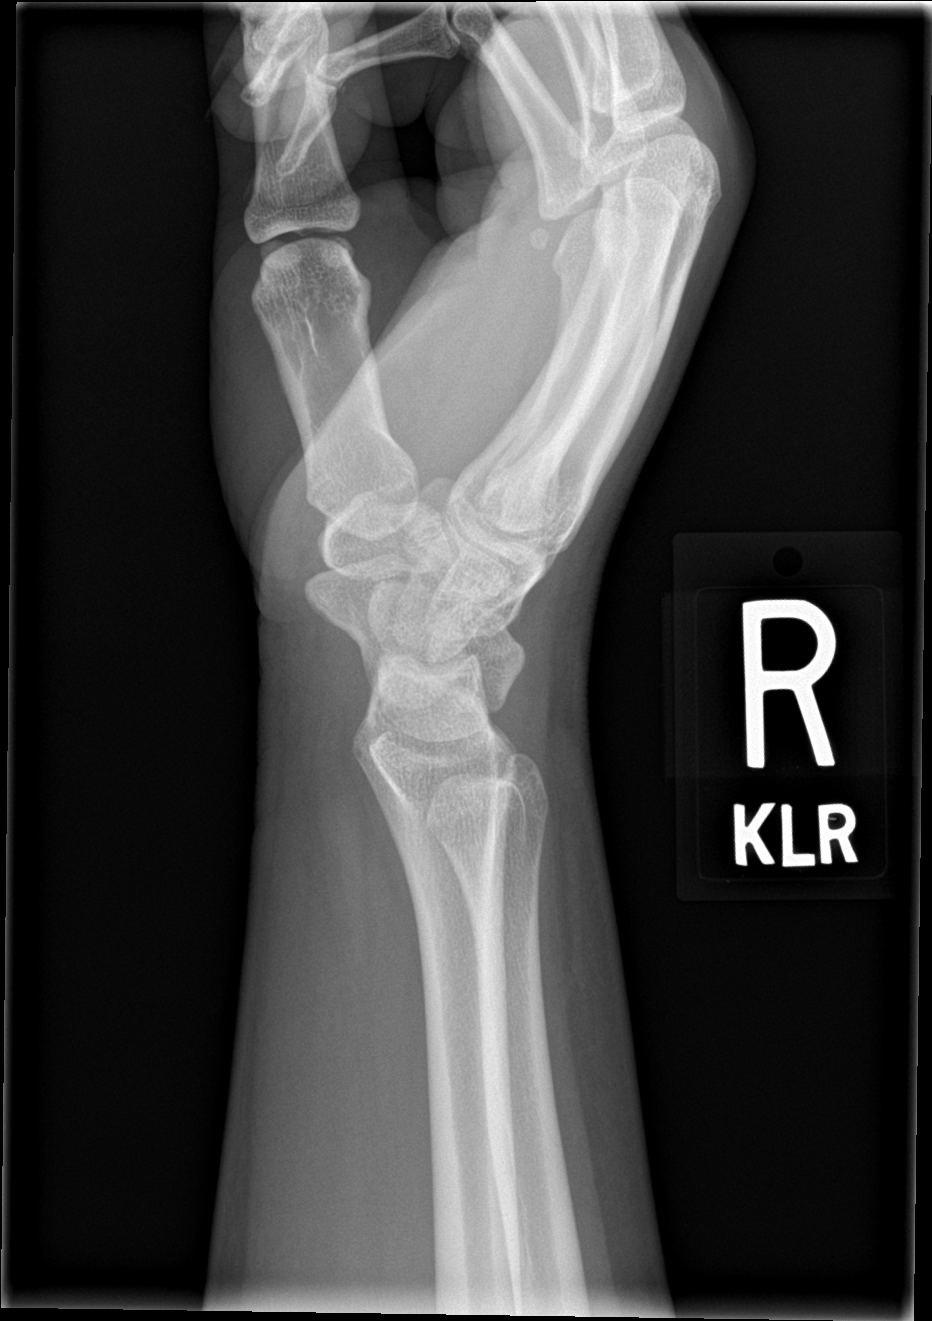
[im 4/4]
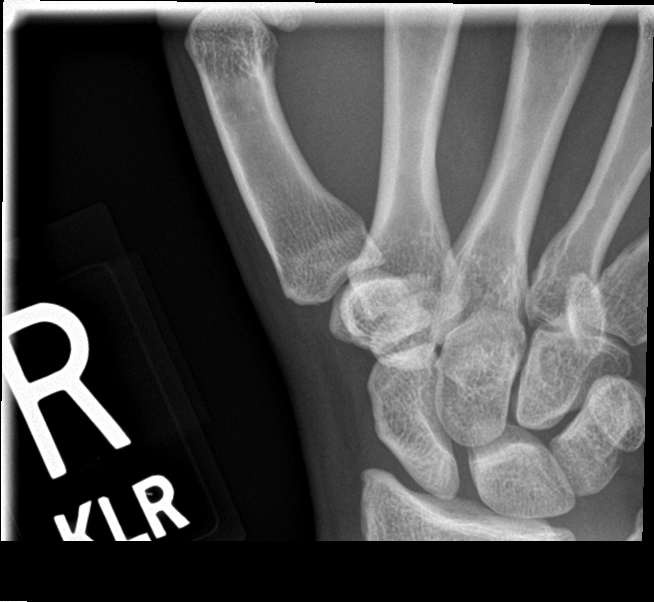

[4 of 4 positions shown; findings below may reference images not displayed]

FINDINGS: Frontal, oblique, lateral, and ulnar deviation scaphoid images were
obtained. No fracture or dislocation. Joint spaces appear normal. No
erosive change.
IMPRESSION: No fracture or dislocation.  No evident arthropathy.

## 2020-11-12 ENCOUNTER — Emergency Department (HOSPITAL_COMMUNITY): Payer: BC Managed Care – PPO

## 2020-11-12 ENCOUNTER — Emergency Department (HOSPITAL_COMMUNITY)
Admission: EM | Admit: 2020-11-12 | Discharge: 2020-11-12 | Discharge status: Against Medical Advice | Payer: BC Managed Care – PPO | Attending: Emergency Medicine | Admitting: Emergency Medicine

## 2020-11-12 ENCOUNTER — Encounter (HOSPITAL_COMMUNITY): Payer: Self-pay | Admitting: Emergency Medicine

## 2020-11-12 ENCOUNTER — Other Ambulatory Visit: Payer: Self-pay

## 2020-11-12 DIAGNOSIS — R0602 Shortness of breath: Secondary | ICD-10-CM | POA: Diagnosis present

## 2020-11-12 DIAGNOSIS — Z794 Long term (current) use of insulin: Secondary | ICD-10-CM | POA: Diagnosis not present

## 2020-11-12 DIAGNOSIS — R079 Chest pain, unspecified: Secondary | ICD-10-CM | POA: Diagnosis not present

## 2020-11-12 DIAGNOSIS — E10319 Type 1 diabetes mellitus with unspecified diabetic retinopathy without macular edema: Secondary | ICD-10-CM | POA: Diagnosis not present

## 2020-11-12 DIAGNOSIS — Z5321 Procedure and treatment not carried out due to patient leaving prior to being seen by health care provider: Secondary | ICD-10-CM

## 2020-11-12 LAB — COMPREHENSIVE METABOLIC PANEL
ALT: 13 U/L (ref 0–44)
AST: 17 U/L (ref 15–41)
Albumin: 3.6 g/dL (ref 3.5–5.0)
Alkaline Phosphatase: 50 U/L (ref 38–126)
Anion gap: 9 (ref 5–15)
BUN: UNDETERMINED mg/dL (ref 6–20)
CO2: 20 mmol/L — ABNORMAL LOW (ref 22–32)
Calcium: 8.8 mg/dL — ABNORMAL LOW (ref 8.9–10.3)
Chloride: 109 mmol/L (ref 98–111)
Creatinine, Ser: 0.58 mg/dL (ref 0.44–1.00)
GFR, Estimated: >60 mL/min (ref >60)
Glucose, Bld: 124 mg/dL — ABNORMAL HIGH (ref 70–99)
Potassium: 3.6 mmol/L (ref 3.5–5.1)
Sodium: 138 mmol/L (ref 135–145)
Total Bilirubin: 0.7 mg/dL (ref 0.3–1.2)
Total Protein: 6.3 g/dL — ABNORMAL LOW (ref 6.5–8.1)

## 2020-11-12 LAB — CBC WITH DIFFERENTIAL/PLATELET
Abs Immature Granulocytes: 0.01 10*3/uL (ref 0.00–0.07)
Basophils Absolute: 0 10*3/uL (ref 0.0–0.1)
Basophils Relative: 0 %
Eosinophils Absolute: 0.1 10*3/uL (ref 0.0–0.5)
Eosinophils Relative: 2 %
HCT: 43.6 % (ref 36.0–46.0)
Hemoglobin: 12.8 g/dL (ref 12.0–15.0)
Immature Granulocytes: 0 %
Lymphocytes Relative: 31 %
Lymphs Abs: 1.5 10*3/uL (ref 0.7–4.0)
MCH: 29.6 pg (ref 26.0–34.0)
MCHC: 29.4 g/dL — ABNORMAL LOW (ref 30.0–36.0)
MCV: 100.7 fL — ABNORMAL HIGH (ref 80.0–100.0)
Monocytes Absolute: 0.4 10*3/uL (ref 0.1–1.0)
Monocytes Relative: 8 %
Neutro Abs: 2.8 10*3/uL (ref 1.7–7.7)
Neutrophils Relative %: 59 %
Platelets: 263 10*3/uL (ref 150–400)
RBC: 4.33 MIL/uL (ref 3.87–5.11)
RDW: 12.9 % (ref 11.5–15.5)
WBC: 4.7 10*3/uL (ref 4.0–10.5)
nRBC: 0 % (ref 0.0–0.2)

## 2020-11-12 LAB — GLUCOSE, CAPILLARY: Glucose-Capillary: 153 mg/dL — ABNORMAL HIGH (ref 70–99)

## 2020-11-12 LAB — TROPONIN I (HIGH SENSITIVITY): Troponin I (High Sensitivity): <2 ng/L (ref <18)

## 2020-11-12 MED ORDER — ALBUTEROL SULFATE HFA 108 (90 BASE) MCG/ACT IN AERS: 2.0000 | INHALATION_SPRAY | RESPIRATORY_TRACT | Status: DC | PRN

## 2020-11-12 NOTE — ED Provider Notes (Signed)
Emergency Medicine Provider Triage Evaluation Note  Jeanne Mosley , a 35 y.o. female  was evaluated in triage.  Pt complains of shortness of breath.  Patient reports onset of shortness of breath which started today.  Feels like she cannot get a deep breath and.  Endorses some mild central chest pain but no pleuritic symptoms.  No leg swelling.  Not on oral OCPs.  Reported a history of a mass.  Denies any cough, fevers, chills.  Reports she does have some stressful events going on in her life.  Review of Systems  Positive: As above Negative: As above   Physical Exam  BP 106/87   Pulse 93   Temp 98.9 F (37.2 C) (Oral)   Resp (!) 22   Ht 5\' 2"  (1.575 m)   Wt 66.7 kg   LMP 10/21/2020   SpO2 100%   BMI 26.89 kg/m  Gen:   Awake, no distress   Resp:  Normal effort MSK:   Moves extremities without difficulty  Other:  Lung sounds clear  Medical Decision Making  Medically screening exam initiated at 2:33 PM.  Appropriate orders placed.  [name] was informed that the remainder of the evaluation will be completed by another provider, this initial triage assessment does not replace that evaluation, and the importance of remaining in the ED until their evaluation is complete.     10/23/2020, PA-C 11/12/20 1434    01/12/21, MD 11/15/20 [phone]

## 2020-11-12 NOTE — ED Notes (Signed)
Patient gave me her stickers and stated "she was leaving and going to a place by her house"

## 2020-11-12 NOTE — ED Triage Notes (Signed)
Patient presents with SOB which started today. She is unsure of the cause.

## 2020-11-12 NOTE — ED Provider Notes (Signed)
[facility]-EMERGENCY DEPT Provider Note   CSN: [id number] Arrival date & time: 11/12/20  1307     History Chief Complaint  Patient presents with   Shortness of Breath    Jeanne Mosley is a 35 y.o. female with hx of MS, type 1 diabetes, depression who presents with who presents with complaint of shortness of breath and chest pain that started this morning. States she walked down the stairs to work when she started having some shortness of breath, which she describes as feeling like she cannot take a full breath. Later started having pain in the center of her chest described as "like a bubble" and that it might feel better if she burped. Nothing changed her pain. Did not change with lunch. 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 At this time, symptoms have spontaneously improved. Does feel more short of breath when talking. No prior VTE. No history of cancer.   Past Medical History:  Diagnosis Date   Cervical dysplasia    Depression    DM (diabetes mellitus), type 1 (HCC)    MS (multiple sclerosis) (HCC)    UTI (urinary tract infection)     Patient Active Problem List   Diagnosis Date Noted   Muscle pain 01/05/2020   Depression 02/19/2019   Pain due to onychomycosis of toenails of both feet 02/05/2019   Diabetes mellitus type I (HCC) 02/05/2019   Seasonal allergic rhinitis 01/03/2019   Proliferative diabetic retinopathy (HCC) 09/30/2018   Human papillomavirus in conditions classified elsewhere and of unspecified site 09/30/2018   Atypical squamous cell changes of cervix undetermined significance favor benign 09/30/2018   Chronic constipation 08/14/2018   Multiple sclerosis (HCC) 11/24/2016   Severe episode of recurrent major depressive disorder, without psychotic features (HCC) 11/24/2016   Uncontrolled type 1 diabetes mellitus with diabetic retinopathy (HCC) 11/24/2016    Past Surgical History:  Procedure Laterality Date    CERVIX LESION DESTRUCTION     CESAREAN SECTION     EYE SURGERY     x4     OB History     Gravida  3   Para  1   Term  1   Preterm  0   AB  2   Living  1      SAB  1   IAB  1   Ectopic  0   Multiple  0   Live Births  1        Obstetric Comments  c-section x 1. SAB x 1. EAB (medical) x 1         History reviewed. No pertinent family history.  Social History   Tobacco Use   Smoking status: Never   Smokeless tobacco: Never  Vaping Use   Vaping Use: Never used  Substance Use Topics   Alcohol use: Yes   Drug use: No    Home Medications Prior to Admission medications   Medication Sig Start Date End Date Taking? Authorizing Provider  ciprofloxacin (CILOXAN) 0.3 % ophthalmic solution Administer 1 drop, every 2 hours, while awake, for 2 days. Then 1 drop, every 4 hours, while awake, for the next 5 days. 02/11/20   Sharman Cheek, MD  Dimethyl Fumarate 240 MG CPDR Take 1 capsule by mouth in the morning and at bedtime.     [provider]  famotidine (PEPCID) 20 MG tablet Take 1 tablet (20 mg total) by mouth 2 (two) times daily. 05/27/19 10/12/19  Paschal Dopp  L., MD  ibuprofen (ADVIL) 600 MG tablet Take 1 tablet (600 mg total) by mouth every 6 (six) hours as needed. 11/15/19   Enid Derry, PA-C  insulin aspart (NOVOLOG) 100 UNIT/ML injection Insulin pump 07/13/19   Concha Se, MD  insulin glargine (LANTUS) 100 UNIT/ML injection Inject 0.22 mLs (22 Units total) into the skin at bedtime. Patient not taking: Reported on 10/12/2019 01/16/17 02/15/17  Arrien, York Ram, MD  ketorolac (ACULAR) 0.5 % ophthalmic solution Place 1 drop into the right eye 4 (four) times daily. 02/11/20   Sharman Cheek, MD  meloxicam (MOBIC) 15 MG tablet Take 15 mg by mouth daily. 09/27/19   [provider]  Needles & Syringes MISC Please give insulin syringes and needles. 01/16/17   Arrien, York Ram, MD  norelgestromin-ethinyl estradiol (ORTHO EVRA) 150-35  MCG/24HR transdermal patch Place 1 patch onto the skin once a week. 10/31/19   Hermina Staggers, MD  Norethindrone Acetate-Ethinyl Estrad-FE (LOESTRIN 24 FE) 1-20 MG-MCG(24) tablet Take 1 tablet by mouth daily. 11/12/19   Hermina Staggers, MD    Allergies    Sulfamethoxazole-trimethoprim, Aspartame, Trimethoprim, Sulfa antibiotics, and Sweetness enhancer  Review of Systems   Review of Systems  Constitutional:  Negative for activity change, chills, diaphoresis, fatigue and fever.  Respiratory:  Positive for shortness of breath. Negative for cough and wheezing.   Cardiovascular:  Positive for chest pain. Negative for palpitations and leg swelling.  Gastrointestinal:  Negative for abdominal pain, nausea and vomiting.  All other systems reviewed and are negative.  Physical Exam Updated Vital Signs BP 106/87   Pulse 93   Temp 98.9 F (37.2 C) (Oral)   Resp (!) 22   Ht 5\' 2"  (1.575 m)   Wt 66.7 kg   LMP 10/21/2020   SpO2 100%   BMI 26.89 kg/m   Physical Exam Vitals and nursing note reviewed.  Constitutional:      Appearance: Normal appearance.  HENT:     Head: Normocephalic and atraumatic.     Right Ear: External ear normal.     Left Ear: External ear normal.     Nose: Nose normal.     Mouth/Throat:     Mouth: Mucous membranes are moist.  Eyes:     Extraocular Movements: Extraocular movements intact.  Cardiovascular:     Rate and Rhythm: Normal rate and regular rhythm.     Heart sounds: No murmur heard.   No friction rub. No gallop.  Pulmonary:     Effort: Pulmonary effort is normal. No tachypnea.     Breath sounds: Normal breath sounds. No decreased breath sounds, wheezing, rhonchi or rales.  Abdominal:     General: Abdomen is flat.  Musculoskeletal:        General: No swelling or deformity. Normal range of motion.     Cervical back: Normal range of motion and neck supple.  Skin:    General: Skin is warm and dry.  Neurological:     General: No focal deficit present.      Mental Status: She is alert and oriented to person, place, and time.  Psychiatric:        Mood and Affect: Mood normal.        Behavior: Behavior normal.    ED Results / Procedures / Treatments   Labs (all labs ordered are listed, but only abnormal results are displayed) Labs Reviewed  GLUCOSE, CAPILLARY - Abnormal; Notable for the following components:      Result Value  Glucose-Capillary 153 (*)    All other components within normal limits  CBC WITH DIFFERENTIAL/PLATELET - Abnormal; Notable for the following components:   MCV 100.7 (*)    MCHC 29.4 (*)    All other components within normal limits  RESP PANEL BY RT-PCR (FLU A&B, COVID) [id number]  COMPREHENSIVE METABOLIC PANEL  TROPONIN I (HIGH SENSITIVITY)    EKG EKG Interpretation  Date/Time:  Friday November 12 2020 13:44:21 EDT Ventricular Rate:  93 PR Interval:  154 QRS Duration: 74 QT Interval:  352 QTC Calculation: 437 R Axis:   32 Text Interpretation: Normal sinus rhythm Low voltage QRS Borderline ECG No significant change since last tracing Confirmed by Susy Frizzle [phone]) on 11/12/2020 1:51:08 PM  Radiology DG Chest 2 View  Result Date: 11/12/2020 CLINICAL DATA:  Shortness of breath EXAM: CHEST - 2 VIEW COMPARISON:  May 27, 2019 FINDINGS: Lungs are clear. Heart size and pulmonary vascularity are normal. No adenopathy. There is mild midthoracic levoscoliosis. IMPRESSION: Lungs clear.  Cardiac silhouette normal. Electronically Signed   By: Bretta Bang III M.D.   On: 11/12/2020 14:24    Procedures Procedures   Medications Ordered in ED Medications  albuterol (VENTOLIN HFA) 108 (90 Base) MCG/ACT inhaler 2 puff (has no administration in time range)    ED Course  I have reviewed the triage vital signs and the nursing notes.  Pertinent labs & imaging results that were available during my care of the patient were reviewed by me and considered in my medical decision making (see chart for details).     MDM Rules/Calculators/A&P                          Patient with complaint of feeling unable to take a deep breath and chest pain that feels like a "bubble" and like she needs to burp. Improved spontaneously by time of evaluation. O2 saturation 100% on room air. Pulmonary exam is normal. EGK reassuring. CXR without abnormalities. No anemia. Troponin negative.   Was considering adding on D-dimer but patient eloped prior to completion of evaluation. Believe there is low risk of VTE. Final Clinical Impression(s) / ED Diagnoses Final diagnoses:  None    Rx / DC Orders ED Discharge Orders     None        Remo Lipps, MD 11/12/20 1628    Melene Plan, DO 11/12/20 1948

## 2020-12-23 ENCOUNTER — Ambulatory Visit (INDEPENDENT_AMBULATORY_CARE_PROVIDER_SITE_OTHER): Payer: BC Managed Care – PPO | Admitting: Neurology

## 2020-12-23 ENCOUNTER — Telehealth: Payer: Self-pay | Admitting: Neurology

## 2020-12-23 ENCOUNTER — Encounter: Payer: Self-pay | Admitting: Neurology

## 2020-12-23 VITALS — BP 111/77 | HR 87 | Ht 62.0 in | Wt 145.5 lb

## 2020-12-23 DIAGNOSIS — R269 Unspecified abnormalities of gait and mobility: Secondary | ICD-10-CM

## 2020-12-23 DIAGNOSIS — R200 Anesthesia of skin: Secondary | ICD-10-CM

## 2020-12-23 DIAGNOSIS — Z79899 Other long term (current) drug therapy: Secondary | ICD-10-CM

## 2020-12-23 DIAGNOSIS — G35 Multiple sclerosis: Secondary | ICD-10-CM

## 2020-12-23 DIAGNOSIS — E10319 Type 1 diabetes mellitus with unspecified diabetic retinopathy without macular edema: Secondary | ICD-10-CM | POA: Diagnosis not present

## 2020-12-23 NOTE — Telephone Encounter (Signed)
Pt has called back and the message from [place], [place] was read to her.  Pt will come back in for needed lab work and will also upload her insurance cards to [employer], pt was reminded to notify once the cards have been uploaded. This is FYI for RN no call back requested

## 2020-12-23 NOTE — Progress Notes (Signed)
[facility]  PATIENT: Jeanne Mosley DOB: [birth year]  REFERRING DOCTOR OR PCP: Penny Pia, MD (neurology); Harrison Mons, PA-C (PCP) SOURCE: Notes from Dr. Sabra Heck, imaging and lab reports from [place].  _________________________________   HISTORICAL  CHIEF COMPLAINT:  Chief Complaint  Patient presents with   New Patient (Initial Visit)    Rm 2, alone. Patient referred by Dr. Sabra Heck from [facility], pt wants to start [place]. Pt has not had her Vumerity in the last 3 months. Pt switched insurance and kept having issues getting medication.     HISTORY OF PRESENT ILLNESS:  I had the pleasure of seeing your patient, Jeanne Mosley, at the [employer] [facility] for neurologic consultation regarding her MS and treatment options.  She is a 35 year old woman who was diagnosed with MS in early 2015.   In 2014 after she had weakness in the right hip and had a few episodes of fecal incontinence.   She then noted numbness in her hands (bilateral).  She had imaging studies that were consistent with MS. she also had a lumbar puncture and the CSF was reportedly consistent with MS.  She moved back to [place] and saw a Garment/textile technologist in [place].   She was started on Tecfidera.    She did well on it.   She had to change to Vumerity for co-pay assistance but had insurance issues with it and has been without medication for the past couple months.   She has had a few relapses with sensory symptoms.   She had no serious exacerbation and had no steroid doses.    More recently, she has been seen Dr. Sabra Heck at [place].  He is retiring.  She would like to consider Ocrevus.       Currently, she is walking fairly well and has had only a few stumbles.   She can walk a mile without rest.   She does not need to use the bannister on stairs.   She notes mild weakness in the right leg but denies spasticity.   She notes numbness in the fingertips.     Vision is stable.   She  has diabetic retinopathy.     She denies diplopia.   Bpwel and bladder are mostly fine with occasional urgency but no incontinence.    She has mild fatigue.   She sleeps well.   She has depression more than  anxiety.  She was on Zoloft 50 mg but stopped due to lack of efficacy.  She notes no cognitive issues.      She was diagnosed with Type 1 IDDM at age 89 momths.   She has diabetic retinopathy but no nephropathy or neuropathy.     Imaging: MRI report 07/02/2018 showed Numerous supratentorial brain lesions consistent the patient's history of demyelinating disease. Two lesions within the frontal lobes demonstrate thin peripheral enhancement concerning for areas of active demyelination.   Laboratory: 08/23/2020 ESR, CRP, C4, C3, TSH, CBC with differential were normal.  Glucose was elevated.  Hemoglobin A1c was 6.8.  REVIEW OF SYSTEMS: Constitutional: No fevers, chills, sweats, or change in appetite Eyes: No visual changes, double vision, eye pain Ear, nose and throat: No hearing loss, ear pain, nasal congestion, sore throat Cardiovascular: No chest pain, palpitations Respiratory:  No shortness of breath at rest or with exertion.   No wheezes GastrointestinaI: No nausea, vomiting, diarrhea, abdominal pain, fecal incontinence Genitourinary:  No dysuria, urinary retention or frequency.  No nocturia. Musculoskeletal:  No neck pain, back pain Integumentary: No rash, pruritus, skin lesions Neurological: as above Psychiatric: No depression at this time.  No anxiety Endocrine: No palpitations, diaphoresis, change in appetite, change in weigh or increased thirst Hematologic/Lymphatic:  No anemia, purpura, petechiae. Allergic/Immunologic: No itchy/runny eyes, nasal congestion, recent allergic reactions, rashes  ALLERGIES: Allergies  Allergen Reactions   Sulfamethoxazole-Trimethoprim Hives and Itching   Aspartame Other (See Comments)   Trimethoprim Other (See Comments)   Sulfa Antibiotics Hives     hives   Sweetness Enhancer Rash    HOME MEDICATIONS:  Current Outpatient Medications:    ciprofloxacin (CILOXAN) 0.3 % ophthalmic solution, Administer 1 drop, every 2 hours, while awake, for 2 days. Then 1 drop, every 4 hours, while awake, for the next 5 days., Disp: 5 mL, Rfl: 0   Dimethyl Fumarate 240 MG CPDR, Take 1 capsule by mouth in the morning and at bedtime. , Disp: , Rfl:    ibuprofen (ADVIL) 600 MG tablet, Take 1 tablet (600 mg total) by mouth every 6 (six) hours as needed., Disp: 30 tablet, Rfl: 0   insulin aspart (NOVOLOG) 100 UNIT/ML injection, Insulin pump, Disp: 10 mL, Rfl: 0   ketorolac (ACULAR) 0.5 % ophthalmic solution, Place 1 drop into the right eye 4 (four) times daily., Disp: 5 mL, Rfl: 0   meloxicam (MOBIC) 15 MG tablet, Take 15 mg by mouth daily., Disp: , Rfl:    Needles & Syringes MISC, Please give insulin syringes and needles., Disp: 90 each, Rfl: 0   norelgestromin-ethinyl estradiol (ORTHO EVRA) 150-35 MCG/24HR transdermal patch, Place 1 patch onto the skin once a week., Disp: 3 patch, Rfl: 12   Norethindrone Acetate-Ethinyl Estrad-FE (LOESTRIN 24 FE) 1-20 MG-MCG(24) tablet, Take 1 tablet by mouth daily., Disp: 1 Package, Rfl: 11   famotidine (PEPCID) 20 MG tablet, Take 1 tablet (20 mg total) by mouth 2 (two) times daily., Disp: 60 tablet, Rfl: 0   insulin glargine (LANTUS) 100 UNIT/ML injection, Inject 0.22 mLs (22 Units total) into the skin at bedtime. (Patient not taking: Reported on 10/12/2019), Disp: 6.6 mL, Rfl: 0  PAST MEDICAL HISTORY: Past Medical History:  Diagnosis Date   Cervical dysplasia    Depression    DM (diabetes mellitus), type 1 (HCC)    MS (multiple sclerosis) (HCC)    UTI (urinary tract infection)     PAST SURGICAL HISTORY: Past Surgical History:  Procedure Laterality Date   CERVIX LESION DESTRUCTION     CESAREAN SECTION     EYE SURGERY     x4    FAMILY HISTORY: Family History  Problem Relation Age of Onset   Cataracts Mother      SOCIAL HISTORY:  Social History   Socioeconomic History   Marital status: Divorced    Spouse name: Not on file   Number of children: 1   Years of education: Not on file   Highest education level: Bachelor's degree (e.g., BA, AB, BS)  Occupational History   Not on file  Tobacco Use   Smoking status: Never   Smokeless tobacco: Never  Vaping Use   Vaping Use: Never used  Substance and Sexual Activity   Alcohol use: Yes   Drug use: No   Sexual activity: Not on file  Other Topics Concern   Not on file  Social History Narrative   Lives with son   Right handed   Caffeine: 4-5 caffeine drinks a day   Social Determinants of Health   Financial Resource Strain: Not  on file  Food Insecurity: Food Insecurity Present   Worried About Charity fundraiser in the Last Year: Sometimes true   Ran Out of Food in the Last Year: Sometimes true  Transportation Needs: No Transportation Needs   Lack of Transportation (Medical): No   Lack of Transportation (Non-Medical): No  Physical Activity: Not on file  Stress: Not on file  Social Connections: Not on file  Intimate Partner Violence: Not on file     PHYSICAL EXAM  Vitals:   12/23/20 0836  BP: 111/77  Pulse: 87  Weight: 145 lb 8 oz (66 kg)  Height: $Remove'5\' 2"'DqQuZbo$  (1.575 m)    Body mass index is 26.61 kg/m.   General: The patient is well-developed and well-nourished and in no acute distress  HEENT:  Head is [place]/AT.  Sclera are anicteric.  Funduscopic exam shows normal optic discs and retinal vessels.  Neck: No carotid bruits are noted.  The neck is nontender.  Cardiovascular: The heart has a regular rate and rhythm with a normal S1 and S2. There were no murmurs, gallops or rubs.    Skin: Extremities are without rash or  edema.  Musculoskeletal:  Back is nontender  Neurologic Exam  Mental status: The patient is alert and oriented x 3 at the time of the examination. The patient has apparent normal recent and remote memory, with  an apparently normal attention span and concentration ability.   Speech is normal.  Cranial nerves: Extraocular movements are full. Pupils are equal, round, and reactive to light and accomodation.  Visual fields are full.  Facial symmetry is present. There is good facial sensation to soft touch bilaterally.Facial strength is normal.  Trapezius and sternocleidomastoid strength is normal. No dysarthria is noted.  The tongue is midline, and the patient has symmetric elevation of the soft palate. No obvious hearing deficits are noted.  Motor:  Muscle bulk is normal.   Tone is normal. Strength is  5 / 5 in all 4 extremities.   Sensory: Sensory testing is intact to pinprick, soft touch and vibration sensation in arms and proximal legs but reduced vibration at toes and reduced pinpriclk at toes.     Coordination: Cerebellar testing reveals good finger-nose-finger and heel-to-shin bilaterally.  Gait and station: Station is normal.   Gait is normal. Tandem gait is wide. Romberg is negative.   Reflexes: Deep tendon reflexes are symmetric and normal bilaterally.   Plantar responses are flexor.    DIAGNOSTIC DATA (LABS, IMAGING, TESTING) - I reviewed patient records, labs, notes, testing and imaging myself where available.  Lab Results  Component Value Date   WBC 4.7 11/12/2020   HGB 12.8 11/12/2020   HCT 43.6 11/12/2020   MCV 100.7 (H) 11/12/2020   PLT 263 11/12/2020      Component Value Date/Time   NA 138 11/12/2020 1517   K 3.6 11/12/2020 1517   CL 109 11/12/2020 1517   CO2 20 (L) 11/12/2020 1517   GLUCOSE 124 (H) 11/12/2020 1517   BUN QUANTITY NOT SUFFICIENT, UNABLE TO PERFORM TEST 11/12/2020 1517   CREATININE 0.58 11/12/2020 1517   CALCIUM 8.8 (L) 11/12/2020 1517   PROT 6.3 (L) 11/12/2020 1517   ALBUMIN 3.6 11/12/2020 1517   AST 17 11/12/2020 1517   ALT 13 11/12/2020 1517   ALKPHOS 50 11/12/2020 1517   BILITOT 0.7 11/12/2020 1517   GFRNONAA >60 11/12/2020 1517   GFRAA >60  10/11/2019 2217       ASSESSMENT AND PLAN  Multiple  sclerosis ([place]) - Plan: HIV antibody (with reflex), Hep B Surface Antibody, Hep B Surface Antigen, Hep C Antibody, Vitamin D, 25-hydroxy, IgG, IgA, IgM, QuantiFERON-TB Gold Plus, CBC with Differential/Platelets, Comprehensive metabolic panel, Varicella zoster antibody, IgG, Hepatitis B core antibody, total, Stratify JCV Antibody Test ([facility]), MR BRAIN W WO CONTRAST, MR CERVICAL SPINE W WO CONTRAST  Type 1 diabetes mellitus with retinopathy, macular edema presence unspecified, unspecified laterality, unspecified retinopathy severity (HCC)  Numbness - Plan: MR BRAIN W WO CONTRAST, MR CERVICAL SPINE W WO CONTRAST  High risk medication use - Plan: HIV antibody (with reflex), Hep B Surface Antibody, Hep B Surface Antigen, Hep C Antibody, Vitamin D, 25-hydroxy, IgG, IgA, IgM, QuantiFERON-TB Gold Plus, CBC with Differential/Platelets, Comprehensive metabolic panel, Varicella zoster antibody, IgG, Hepatitis B core antibody, total, Stratify JCV Antibody Test ([facility])  Gait disturbance - Plan: MR BRAIN W WO CONTRAST, MR CERVICAL SPINE W WO CONTRAST   In summary, Ms. Arentz is a 35 year old woman with multiple sclerosis diagnosed in 2015 who has been stable on Tecfidera and Vumerity clinically.  However, the MRI report from 2020 showed 2 enhancing lesions implying that there is some progression over time.  Therefore, stepping up to a higher efficacy medicine should be beneficial.  We discussed options.  She was most interested in [place].  We went over the risks (increase risk of infection, increased risk of breast cancer) and benefits.  We will check blood work to make sure she does not have any chronic infections or contraindication to get started.  If she has trouble tolerating Ocrevus consider Tysabri or Mavenclad.  I will also check an MRI of the brain and cervical spine.  I have requested the actual images from 2020 and any other images from  [facility] for comparison.  She will return to see me in 3 to 4 months or sooner for new or worsening neurologic symptoms.  Thank you for asking me to see this patient.  Please let me know if I can be of further assistance with her or other patients in the future.    Wrenly Lauritsen A. Felecia Shelling, MD, [facility] 3/76/2831, 5:17 AM Certified in Neurology, Clinical Neurophysiology, Sleep Medicine and Neuroimaging  [facility] [address] [place], [place] [address] [phone]

## 2020-12-23 NOTE — Telephone Encounter (Signed)
Called the pt, there was no answer. Left a detailed message advising the patient that there is an additional lab that needs to be drawn that is sent off.  Asked the patient if she could possibly come back to have that drawn today. Advised the patient also when she comes in, to bring her insurance cards, we will need that information when trying to get medications approved.   **If pt calls back, please review above

## 2020-12-27 ENCOUNTER — Encounter: Payer: Self-pay | Admitting: Neurology

## 2020-12-29 LAB — COMPREHENSIVE METABOLIC PANEL
ALT: 16 IU/L (ref 0–32)
AST: 17 IU/L (ref 0–40)
Albumin/Globulin Ratio: 1.5 (ref 1.2–2.2)
Albumin: 4.4 g/dL (ref 3.8–4.8)
Alkaline Phosphatase: 62 IU/L (ref 44–121)
BUN/Creatinine Ratio: 8 — ABNORMAL LOW (ref 9–23)
BUN: 6 mg/dL (ref 6–20)
Bilirubin Total: 0.2 mg/dL (ref 0.0–1.2)
CO2: 23 mmol/L (ref 20–29)
Calcium: 9.2 mg/dL (ref 8.7–10.2)
Chloride: 103 mmol/L (ref 96–106)
Creatinine, Ser: 0.79 mg/dL (ref 0.57–1.00)
Globulin, Total: 3 g/dL (ref 1.5–4.5)
Glucose: 96 mg/dL (ref 65–99)
Potassium: 4.2 mmol/L (ref 3.5–5.2)
Sodium: 144 mmol/L (ref 134–144)
Total Protein: 7.4 g/dL (ref 6.0–8.5)
eGFR: 101 mL/min/{1.73_m2} (ref >59)

## 2020-12-29 LAB — CBC WITH DIFFERENTIAL/PLATELET
Basophils Absolute: 0 10*3/uL (ref 0.0–0.2)
Basos: 0 %
EOS (ABSOLUTE): 0 10*3/uL (ref 0.0–0.4)
Eos: 1 %
Hematocrit: 39.5 % (ref 34.0–46.6)
Hemoglobin: 13.1 g/dL (ref 11.1–15.9)
Immature Grans (Abs): 0 10*3/uL (ref 0.0–0.1)
Immature Granulocytes: 0 %
Lymphocytes Absolute: 1.6 10*3/uL (ref 0.7–3.1)
Lymphs: 40 %
MCH: 29.7 pg (ref 26.6–33.0)
MCHC: 33.2 g/dL (ref 31.5–35.7)
MCV: 90 fL (ref 79–97)
Monocytes Absolute: 0.3 10*3/uL (ref 0.1–0.9)
Monocytes: 6 %
Neutrophils Absolute: 2.1 10*3/uL (ref 1.4–7.0)
Neutrophils: 53 %
Platelets: 322 10*3/uL (ref 150–450)
RBC: 4.41 x10E6/uL (ref 3.77–5.28)
RDW: 11.9 % (ref 11.7–15.4)
WBC: 4 10*3/uL (ref 3.4–10.8)

## 2020-12-29 LAB — HIV ANTIBODY (ROUTINE TESTING W REFLEX): HIV Screen 4th Generation wRfx: NONREACTIVE

## 2020-12-29 LAB — QUANTIFERON-TB GOLD PLUS
QuantiFERON Mitogen Value: >10 IU/mL
QuantiFERON Nil Value: 0.03 IU/mL
QuantiFERON TB1 Ag Value: 0.05 IU/mL
QuantiFERON TB2 Ag Value: 0.05 IU/mL
QuantiFERON-TB Gold Plus: NEGATIVE

## 2020-12-29 LAB — IGG, IGA, IGM
IgA/Immunoglobulin A, Serum: 257 mg/dL (ref 87–352)
IgG (Immunoglobin G), Serum: 1379 mg/dL (ref 586–1602)
IgM (Immunoglobulin M), Srm: 170 mg/dL (ref 26–217)

## 2020-12-29 LAB — HEPATITIS B SURFACE ANTIGEN: Hepatitis B Surface Ag: NEGATIVE

## 2020-12-29 LAB — HEPATITIS B CORE ANTIBODY, TOTAL: Hep B Core Total Ab: NEGATIVE

## 2020-12-29 LAB — VITAMIN D 25 HYDROXY (VIT D DEFICIENCY, FRACTURES): Vit D, 25-Hydroxy: 6.3 ng/mL — ABNORMAL LOW (ref 30.0–100.0)

## 2020-12-29 LAB — VARICELLA ZOSTER ANTIBODY, IGG: Varicella zoster IgG: 2304 index (ref >165)

## 2020-12-29 LAB — HEPATITIS C ANTIBODY: Hep C Virus Ab: 0.1 s/co ratio (ref 0.0–0.9)

## 2020-12-29 LAB — HEPATITIS B SURFACE ANTIBODY,QUALITATIVE: Hep B Surface Ab, Qual: REACTIVE

## 2020-12-30 ENCOUNTER — Other Ambulatory Visit: Payer: Self-pay | Admitting: *Deleted

## 2020-12-30 DIAGNOSIS — E559 Vitamin D deficiency, unspecified: Secondary | ICD-10-CM

## 2020-12-30 MED ORDER — VITAMIN D (ERGOCALCIFEROL) 1.25 MG (50000 UNIT) PO CAPS: 50000.0000 [IU] | ORAL_CAPSULE | ORAL | 3 refills | Status: DC

## 2021-01-02 ENCOUNTER — Telehealth: Payer: Self-pay | Admitting: Neurology

## 2021-01-02 NOTE — Telephone Encounter (Signed)
I personally reviewed the MRI of the brain dated 07/02/2018  It shows multiple T2/FLAIR hyperintense foci in the hemispheres.  A couple foci have a tumefactive appearance.  1 focus in the left posterior frontal lobe deep white matter has also been enhancement, consistent with a subacute focus.  At her initial consultation visit we had discussed switching to Ocrevus.  Lab work was fine to do so.

## 2021-01-13 ENCOUNTER — Other Ambulatory Visit: Payer: BC Managed Care – PPO

## 2021-01-17 ENCOUNTER — Telehealth: Payer: Self-pay

## 2021-01-17 NOTE — Telephone Encounter (Signed)
Received patient's insurance card.  Ocrevus order signed by Dr. Epimenio Foot.  Start form faxed to [place].  Received a receipt of confirmation.  Ocrevus order and start form given to [facility].

## 2021-01-27 ENCOUNTER — Ambulatory Visit
Admission: RE | Admit: 2021-01-27 | Discharge: 2021-01-27 | Disposition: A | Discharge status: Home / Self Care | Payer: BC Managed Care – PPO | Source: Ambulatory Visit | Attending: Neurology | Admitting: Neurology

## 2021-01-27 DIAGNOSIS — R269 Unspecified abnormalities of gait and mobility: Secondary | ICD-10-CM

## 2021-01-27 DIAGNOSIS — R200 Anesthesia of skin: Secondary | ICD-10-CM

## 2021-01-27 DIAGNOSIS — G35 Multiple sclerosis: Secondary | ICD-10-CM

## 2021-01-27 MED ORDER — GADOBENATE DIMEGLUMINE 529 MG/ML IV SOLN
13.0000 mL | Freq: Once | INTRAVENOUS | Status: AC | PRN
  Administered 2021-01-27: 13 mL via INTRAVENOUS

## 2021-02-09 ENCOUNTER — Encounter: Payer: Self-pay | Admitting: Neurology

## 2021-02-28 NOTE — Telephone Encounter (Signed)
Patient had her first Ocrevus 300mg  IV infusion on 02/24/2021.

## 2021-04-08 ENCOUNTER — Emergency Department
Admission: EM | Admit: 2021-04-08 | Discharge: 2021-04-08 | Disposition: A | Discharge status: Home / Self Care | Payer: BC Managed Care – PPO | Attending: Emergency Medicine | Admitting: Emergency Medicine

## 2021-04-08 ENCOUNTER — Other Ambulatory Visit: Payer: Self-pay

## 2021-04-08 ENCOUNTER — Encounter: Payer: Self-pay | Admitting: Emergency Medicine

## 2021-04-08 DIAGNOSIS — E103599 Type 1 diabetes mellitus with proliferative diabetic retinopathy without macular edema, unspecified eye: Secondary | ICD-10-CM | POA: Diagnosis not present

## 2021-04-08 DIAGNOSIS — H5789 Other specified disorders of eye and adnexa: Secondary | ICD-10-CM | POA: Diagnosis present

## 2021-04-08 DIAGNOSIS — Z794 Long term (current) use of insulin: Secondary | ICD-10-CM | POA: Diagnosis not present

## 2021-04-08 DIAGNOSIS — H01004 Unspecified blepharitis left upper eyelid: Secondary | ICD-10-CM | POA: Insufficient documentation

## 2021-04-08 DIAGNOSIS — H01001 Unspecified blepharitis right upper eyelid: Secondary | ICD-10-CM | POA: Insufficient documentation

## 2021-04-08 MED ORDER — PREDNISONE 20 MG PO TABS
60.0000 mg | ORAL_TABLET | Freq: Once | ORAL | Status: AC
  Administered 2021-04-08: 60 mg via ORAL
  Filled 2021-04-08: qty 3

## 2021-04-08 MED ORDER — FAMOTIDINE 20 MG PO TABS
20.0000 mg | ORAL_TABLET | Freq: Once | ORAL | Status: AC
  Administered 2021-04-08: 20 mg via ORAL
  Filled 2021-04-08: qty 1

## 2021-04-08 MED ORDER — PREDNISONE 20 MG PO TABS: 40.0000 mg | ORAL_TABLET | Freq: Every day | ORAL | 0 refills | Status: AC

## 2021-04-08 MED ORDER — FAMOTIDINE 20 MG PO TABS: 20.0000 mg | ORAL_TABLET | Freq: Two times a day (BID) | ORAL | 0 refills | Status: DC

## 2021-04-08 NOTE — ED Provider Notes (Signed)
Emergency Medicine Provider Triage Evaluation Note  Jeanne Mosley , a 35 y.o. female  was evaluated in triage.  Pt complains of bilateral eyelid swelling. Symptoms started 4 days ago. Some relief with Benadryl. No known new exposures or new hygiene products. No drainage.  Review of Systems  Positive: Eyelid swelling Negative: Conjunctival drainage, fever  Physical Exam  BP 116/84 (BP Location: Left Arm)   Pulse 89   Temp 98.5 F (36.9 C) (Oral)   Resp 20   Ht 5\' 2"  (1.575 m)   Wt 66.7 kg   SpO2 98%   BMI 26.89 kg/m  Gen:   Awake, no distress   Resp:  Normal effort  MSK:   Moves extremities without difficulty Other:    Medical Decision Making  Medically screening exam initiated at 5:30 PM.  Appropriate orders placed.  Jeanne Mosley was informed that the remainder of the evaluation will be completed by another provider, this initial triage assessment does not replace that evaluation, and the importance of remaining in the ED until their evaluation is complete.   , FNP 04/08/21 1732    13/04/22, MD 04/08/21 1942

## 2021-04-08 NOTE — ED Triage Notes (Signed)
Pt comes into the ED via POV c/o bilateral eye swelling.  Pt denies getting anything in the eyes.  Pt denies being around anyone that is sick.  Pt denies any new lotions or creams.  Pt states that her eyes hurt, but do not burn.

## 2021-04-08 NOTE — ED Notes (Signed)
Foow up ent info provided , rx sent to pharm all questions answered

## 2021-04-08 NOTE — ED Provider Notes (Signed)
[facility] Emergency Department Provider Note ____________________________________________  Time seen: 2020  I have reviewed the triage vital signs and the nursing notes.  HISTORY  Chief Complaint  Eye swelling   HPI Jeanne Mosley is a 35 y.o. female with the below medical history including MS and diabetes, presents to the ED with bilateral eyelid swelling.  Patient denies any injury, trauma irritation, or allergic/irritant exposures.  She denies any insect bites or bee stings.  She denies any skin irritation, itching, or rash.  She also denies any visual disturbance, purulent drainage, or foreign body sensation.  Past Medical History:  Diagnosis Date   Cervical dysplasia    Depression    DM (diabetes mellitus), type 1 (HCC)    MS (multiple sclerosis) (HCC)    UTI (urinary tract infection)     Patient Active Problem List   Diagnosis Date Noted   Muscle pain 01/05/2020   Depression 02/19/2019   Pain due to onychomycosis of toenails of both feet 02/05/2019   Diabetes mellitus type I (HCC) 02/05/2019   Seasonal allergic rhinitis 01/03/2019   Proliferative diabetic retinopathy (HCC) 09/30/2018   Human papillomavirus in conditions classified elsewhere and of unspecified site 09/30/2018   Atypical squamous cell changes of cervix undetermined significance favor benign 09/30/2018   Chronic constipation 08/14/2018   Multiple sclerosis (HCC) 11/24/2016   Severe episode of recurrent major depressive disorder, without psychotic features (HCC) 11/24/2016   Uncontrolled type 1 diabetes mellitus with diabetic retinopathy 11/24/2016    Past Surgical History:  Procedure Laterality Date   CERVIX LESION DESTRUCTION     CESAREAN SECTION     EYE SURGERY     x4    Prior to Admission medications   Medication Sig Start Date End Date Taking? Authorizing Provider  famotidine (PEPCID) 20 MG tablet Take 1 tablet (20 mg total) by mouth 2 (two) times daily for 10  days. 04/08/21 04/18/21 Yes Yaret Hush, Charlesetta Ivory, PA-C  predniSONE (DELTASONE) 20 MG tablet Take 2 tablets (40 mg total) by mouth daily with breakfast for 5 days. 04/08/21 04/13/21 Yes Jelani Vreeland, Charlesetta Ivory, PA-C  ciprofloxacin (CILOXAN) 0.3 % ophthalmic solution Administer 1 drop, every 2 hours, while awake, for 2 days. Then 1 drop, every 4 hours, while awake, for the next 5 days. 02/11/20   Sharman Cheek, MD  Dimethyl Fumarate 240 MG CPDR Take 1 capsule by mouth in the morning and at bedtime.     [provider]  famotidine (PEPCID) 20 MG tablet Take 1 tablet (20 mg total) by mouth 2 (two) times daily. 05/27/19 10/12/19  Miguel Aschoff., MD  ibuprofen (ADVIL) 600 MG tablet Take 1 tablet (600 mg total) by mouth every 6 (six) hours as needed. 11/15/19   Enid Derry, PA-C  insulin aspart (NOVOLOG) 100 UNIT/ML injection Insulin pump 07/13/19   Concha Se, MD  insulin glargine (LANTUS) 100 UNIT/ML injection Inject 0.22 mLs (22 Units total) into the skin at bedtime. Patient not taking: Reported on 10/12/2019 01/16/17 02/15/17  Arrien, York Ram, MD  ketorolac (ACULAR) 0.5 % ophthalmic solution Place 1 drop into the right eye 4 (four) times daily. 02/11/20   Sharman Cheek, MD  meloxicam (MOBIC) 15 MG tablet Take 15 mg by mouth daily. 09/27/19   [provider]  Needles & Syringes MISC Please give insulin syringes and needles. 01/16/17   Arrien, York Ram, MD  norelgestromin-ethinyl estradiol (ORTHO EVRA) 150-35 MCG/24HR transdermal patch Place 1 patch onto the skin once a  week. 10/31/19   Hermina Staggers, MD  Norethindrone Acetate-Ethinyl Estrad-FE (LOESTRIN 24 FE) 1-20 MG-MCG(24) tablet Take 1 tablet by mouth daily. 11/12/19   Hermina Staggers, MD  Vitamin D, Ergocalciferol, (DRISDOL) 1.25 MG (50000 UNIT) CAPS capsule Take 1 capsule (50,000 Units total) by mouth every 7 (seven) days. 12/30/20   Sater, Pearletha Furl, MD    Allergies Sulfamethoxazole-trimethoprim, Aspartame,  Trimethoprim, Sulfa antibiotics, and Sweetness enhancer  Family History  Problem Relation Age of Onset   Cataracts Mother     Social History Social History   Tobacco Use   Smoking status: Never   Smokeless tobacco: Never  Vaping Use   Vaping Use: Never used  Substance Use Topics   Alcohol use: Yes   Drug use: No    Review of Systems  Constitutional: Negative for fever. Eyes: Negative for visual changes. ENT: Negative for sore throat.  Eyelid swelling as above Cardiovascular: Negative for chest pain. Respiratory: Negative for shortness of breath. Gastrointestinal: Negative for abdominal pain, vomiting and diarrhea. Genitourinary: Negative for dysuria. Musculoskeletal: Negative for back pain. Skin: Negative for rash. Neurological: Negative for headaches, focal weakness or numbness. ____________________________________________  PHYSICAL EXAM:  VITAL SIGNS: ED Triage Vitals  Enc Vitals Group     BP 04/08/21 1726 116/84     Pulse Rate 04/08/21 1726 89     Resp 04/08/21 1726 20     Temp 04/08/21 1726 98.5 F (36.9 C)     Temp Source 04/08/21 1726 Oral     SpO2 04/08/21 1726 98 %     Weight 04/08/21 1726 147 lb (66.7 kg)     Height 04/08/21 1726 5\' 2"  (1.575 m)     Head Circumference --      Peak Flow --      Pain Score 04/08/21 1730 3     Pain Loc --      Pain Edu? --      Excl. in GC? --     Constitutional: Alert and oriented. Well appearing and in no distress. Head: Normocephalic and atraumatic. Eyes: Conjunctivae are normal. PERRL. Normal extraocular movements.  Mild blepharitis noted to the bilateral upper lids.  No matting, crusting, purulence is appreciated. Ears: Canals clear. TMs intact bilaterally. Nose: No congestion/rhinorrhea/epistaxis. Mouth/Throat: Mucous membranes are moist.  Uvula is midline and tonsils are flat. Neck: Supple. No thyromegaly. Hematological/Lymphatic/Immunological: No cervical or preauricular lymphadenopathy. Cardiovascular:  Normal rate, regular rhythm. Normal distal pulses. Respiratory: Normal respiratory effort. No wheezes/rales/rhonchi. Gastrointestinal: Soft and nontender. No distention. Musculoskeletal: Nontender with normal range of motion in all extremities.  Neurologic:  Normal gait without ataxia. Normal speech and language. No gross focal neurologic deficits are appreciated. Skin:  Skin is warm, dry and intact. No rash noted. Psychiatric: Mood and affect are normal. Patient exhibits appropriate insight and judgment. ____________________________________________    {LABS (pertinent positives/negatives)  ____________________________________________  {EKG  ____________________________________________   RADIOLOGY Official radiology report(s): No results found. ____________________________________________  PROCEDURES  Prednisone 60 mg p.o. Famotidine 20 mg p.o.  Procedures ____________________________________________   INITIAL IMPRESSION / ASSESSMENT AND PLAN / ED COURSE  As part of my medical decision making, I reviewed the following data within the electronic MEDICAL RECORD NUMBER Notes from prior ED visits   DDX: contact dermatitis, allergic reaction, angioedema  Patient with ED evaluation of bilateral upper lid edema without known incident or allergen contact.  Patient denies any itching or irritation.  She notes some fullness of the eyes but denies any visual disturbance.  Patient is otherwise stable without other signs of angioedema or mucous membrane involvement.  She be treated empirically with antihistamines and steroids.  She is advised to follow with primary provider or report to [facility] for further evaluation of this.  Return precautions been reviewed.  Jeanne Mosley was evaluated in Emergency Department on 04/08/2021 for the symptoms described in the history of present illness. She was evaluated in the context of the global COVID-19 pandemic, which necessitated  consideration that the patient might be at risk for infection with the SARS-CoV-2 virus that causes COVID-19. Institutional protocols and algorithms that pertain to the evaluation of patients at risk for COVID-19 are in a state of rapid change based on information released by regulatory bodies including the CDC and federal and state organizations. These policies and algorithms were followed during the patient's care in the ED.  ____________________________________________  FINAL CLINICAL IMPRESSION(S) / ED DIAGNOSES  Final diagnoses:  Blepharitis of upper eyelids of both eyes, unspecified type      Karmen Stabs, Charlesetta Ivory, PA-C 04/08/21 2353    Phineas Semen, MD 04/09/21 [id number]

## 2021-04-08 NOTE — Discharge Instructions (Signed)
You are being treated for swelling of the lids at this time.  The cause is unknown.  Take the steroid, antihistamine as directed.  Take over-the-counter Zyrtec and Benadryl as directed.  Apply cool compresses to reduce swelling.  Follow-up with [facility] or return to the ED for worsening symptoms.

## 2021-06-30 ENCOUNTER — Ambulatory Visit (INDEPENDENT_AMBULATORY_CARE_PROVIDER_SITE_OTHER): Payer: BC Managed Care – PPO | Admitting: Neurology

## 2021-06-30 ENCOUNTER — Encounter: Payer: Self-pay | Admitting: Neurology

## 2021-06-30 VITALS — BP 110/81 | HR 81 | Ht 62.0 in | Wt 150.0 lb

## 2021-06-30 DIAGNOSIS — E10319 Type 1 diabetes mellitus with unspecified diabetic retinopathy without macular edema: Secondary | ICD-10-CM

## 2021-06-30 DIAGNOSIS — R269 Unspecified abnormalities of gait and mobility: Secondary | ICD-10-CM

## 2021-06-30 DIAGNOSIS — G35 Multiple sclerosis: Secondary | ICD-10-CM | POA: Diagnosis not present

## 2021-06-30 DIAGNOSIS — Z79899 Other long term (current) drug therapy: Secondary | ICD-10-CM

## 2021-06-30 DIAGNOSIS — R200 Anesthesia of skin: Secondary | ICD-10-CM | POA: Diagnosis not present

## 2021-06-30 DIAGNOSIS — E559 Vitamin D deficiency, unspecified: Secondary | ICD-10-CM

## 2021-06-30 NOTE — Progress Notes (Signed)
[facility]  PATIENT: Jeanne Mosley DOB: 1986/05/22  REFERRING DOCTOR OR PCP: Paulino Rily, MD (neurology); Porfirio Oar, PA-C (PCP) SOURCE: Notes from Dr. Hyacinth Meeker, imaging and lab reports from [facility].  _________________________________   HISTORICAL  CHIEF COMPLAINT:  Chief Complaint  Patient presents with   Follow-up    Pt alone, rm 7. Pt states everything well. MS- on Ocrevus- last infusion was October. Scheduled in April for next infusion. Pt hit head on Sunday and having lingering headaches. She denies LOC just wanted to make sure ok. She also would like a accomodation at work to be allowed to take frequent bathroom breaks as needed.     HISTORY OF PRESENT ILLNESS:  Jeanne Mosley is a 36 y.o. woman with RRMS   Update 06/30/2021. She started Ocrevus and tolerated it well.    She notes no exacerbaions or new MS symptoms.     Currently, she is walking fairly well and does not need support.  She  as had only a few stumbles.   She can walk a mile without rest.   She uses the bannister on some stairs bu not the one at her home.   She has no major weakness and no spasticity.  .   She notes numbness in the fingertips.     Vision is stable.   She has diabetic retinopathy.     She denies diplopia.   Bowel and bladder are mostly fine with occasional urgency but no incontinence.    She has mild fatigue, better since supplementing VIt D (50,000 U weekly).   She sleeps poorly some nights more due to sleep maintenance issues.   Mood is doing well.   She notes no cognitive issues.      She was diagnosed with Type 1 IDDM at age 36 momths.  Sugars are usually well controlled.   She has diabetic retinopathy but no nephropathy or neuropathy.     MS HIstory: She was diagnosed with MS in early 2015.   In 2014 after she had weakness in the right hip and had a few episodes of fecal incontinence.   She then noted numbness in her hands (bilateral).  She had imaging  studies that were consistent with MS. she also had a lumbar puncture and the CSF was reportedly consistent with MS.  She moved back to [place] and saw a Insurance account manager in [place].   She was started on Tecfidera.    She did well on it.   She had to change to Vumerity for co-pay assistance but had insurance issues with it and has been without medication for the past couple months.   She has had a few relapses with sensory symptoms.   She had no serious exacerbation and had no steroid doses.        Imaging: MRI report 07/02/2018 showed Numerous supratentorial brain lesions consistent the patient's history of demyelinating disease. Two lesions within the frontal lobes demonstrate thin peripheral enhancement concerning for areas of active demyelination.   MRI 01/27/2021 showed Multiple T2/FLAIR hyperintense foci in the hemispheres, thalamus and spinal cord in a pattern and configuration consistent with demyelinating plaque associated with multiple sclerosis.  4 of the foci enhance with contrast and are consistent with acute demyelinating plaques.  MRI cervical spine 01/27/2021 showed   T2 hyperintense foci within the spinal cord as detailed above.  These are located at the cervicomedullary junction, and adjacent to C2, C2-C3, C4, C4-C5 and T1.  None of the foci  enhance. Mild spinal stenosis at C3-C4 C4-C5 and borderline spinal stenosis at C5-C6 due to mild degenerative changes as detailed above.  There is no nerve root compression at these or other levels.  Laboratory: 08/23/2020 ESR, CRP, C4, C3, TSH, CBC with differential were normal.  Glucose was elevated.  Hemoglobin A1c was 6.8.  12/23/2020:  Hep B, Hep C, TB all non-reactive.   VZV Ab is immune.    REVIEW OF SYSTEMS: Constitutional: No fevers, chills, sweats, or change in appetite Eyes: No visual changes, double vision, eye pain Ear, nose and throat: No hearing loss, ear pain, nasal congestion, sore throat Cardiovascular: No chest pain,  palpitations Respiratory:  No shortness of breath at rest or with exertion.   No wheezes GastrointestinaI: No nausea, vomiting, diarrhea, abdominal pain, fecal incontinence Genitourinary:  No dysuria, urinary retention or frequency.  No nocturia. Musculoskeletal:  No neck pain, back pain Integumentary: No rash, pruritus, skin lesions Neurological: as above Psychiatric: No depression at this time.  No anxiety Endocrine: No palpitations, diaphoresis, change in appetite, change in weigh or increased thirst Hematologic/Lymphatic:  No anemia, purpura, petechiae. Allergic/Immunologic: No itchy/runny eyes, nasal congestion, recent allergic reactions, rashes  ALLERGIES: Allergies  Allergen Reactions   Sulfamethoxazole-Trimethoprim Hives and Itching   Aspartame Other (See Comments)   Trimethoprim Other (See Comments)   Sulfa Antibiotics Hives    hives   Sweetness Enhancer Rash    HOME MEDICATIONS:  Current Outpatient Medications:    ibuprofen (ADVIL) 600 MG tablet, Take 1 tablet (600 mg total) by mouth every 6 (six) hours as needed., Disp: 30 tablet, Rfl: 0   insulin aspart (NOVOLOG) 100 UNIT/ML injection, Insulin pump, Disp: 10 mL, Rfl: 0   Needles & Syringes MISC, Please give insulin syringes and needles., Disp: 90 each, Rfl: 0   Vitamin D, Ergocalciferol, (DRISDOL) 1.25 MG (50000 UNIT) CAPS capsule, Take 1 capsule (50,000 Units total) by mouth every 7 (seven) days., Disp: 13 capsule, Rfl: 3  PAST MEDICAL HISTORY: Past Medical History:  Diagnosis Date   Cervical dysplasia    Depression    DM (diabetes mellitus), type 1 (HCC)    MS (multiple sclerosis) (HCC)    UTI (urinary tract infection)     PAST SURGICAL HISTORY: Past Surgical History:  Procedure Laterality Date   CERVIX LESION DESTRUCTION     CESAREAN SECTION     EYE SURGERY     x4    FAMILY HISTORY: Family History  Problem Relation Age of Onset   Cataracts Mother     SOCIAL HISTORY:  Social History    Socioeconomic History   Marital status: Divorced    Spouse name: Not on file   Number of children: 1   Years of education: Not on file   Highest education level: Bachelor's degree (e.g., BA, AB, BS)  Occupational History   Not on file  Tobacco Use   Smoking status: Never   Smokeless tobacco: Never  Vaping Use   Vaping Use: Never used  Substance and Sexual Activity   Alcohol use: Yes   Drug use: No   Sexual activity: Not on file  Other Topics Concern   Not on file  Social History Narrative   Lives with son   Right handed   Caffeine: 4-5 caffeine drinks a day   Social Determinants of Health   Financial Resource Strain: Not on file  Food Insecurity: Not on file  Transportation Needs: Not on file  Physical Activity: Not on file  Stress: Not  on file  Social Connections: Not on file  Intimate Partner Violence: Not on file     PHYSICAL EXAM  Vitals:   06/30/21 1059  BP: 110/81  Pulse: 81  Weight: 150 lb (68 kg)  Height: $Remove'5\' 2"'[id number]$  (1.575 m)    Body mass index is 27.44 kg/m.   General: The patient is well-developed and well-nourished and in no acute distress  HEENT:  Head is [place]/AT.  Sclera are anicteric.   Skin: Extremities are without rash or  edema.   Neurologic Exam  Mental status: The patient is alert and oriented x 3 at the time of the examination. The patient has apparent normal recent and remote memory, with an apparently normal attention span and concentration ability.   Speech is normal.  Cranial nerves: Extraocular movements are full..  Normal facial strength and sensation No obvious hearing deficits are noted.  Motor:  Muscle bulk is normal.   Tone is normal. Strength is  5 / 5 in all 4 extremities.   Sensory: Sensory testing is intact to pinprick, soft touch and vibration sensation in arms and proximal legs but reduced vibration at toes    Coordination: Cerebellar testing reveals good finger-nose-finger and heel-to-shin bilaterally.  Gait and  station: Station is normal.   Gait is normal. Tandem gait is wide.  Romberg is negative.     Reflexes: Deep tendon reflexes are symmetric and normal bilaterally.       DIAGNOSTIC DATA (LABS, IMAGING, TESTING) - I reviewed patient records, labs, notes, testing and imaging myself where available.  Lab Results  Component Value Date   WBC 4.0 12/23/2020   HGB 13.1 12/23/2020   HCT 39.5 12/23/2020   MCV 90 12/23/2020   PLT 322 12/23/2020      Component Value Date/Time   NA 144 12/23/2020 1013   K 4.2 12/23/2020 1013   CL 103 12/23/2020 1013   CO2 23 12/23/2020 1013   GLUCOSE 96 12/23/2020 1013   GLUCOSE 124 (H) 11/12/2020 1517   BUN 6 12/23/2020 1013   CREATININE 0.79 12/23/2020 1013   CALCIUM 9.2 12/23/2020 1013   PROT 7.4 12/23/2020 1013   ALBUMIN 4.4 12/23/2020 1013   AST 17 12/23/2020 1013   ALT 16 12/23/2020 1013   ALKPHOS 62 12/23/2020 1013   BILITOT 0.2 12/23/2020 1013   GFRNONAA >60 11/12/2020 1517   GFRAA >60 10/11/2019 2217       ASSESSMENT AND PLAN  Multiple sclerosis (HCC)  Numbness  Gait disturbance  Type 1 diabetes mellitus with retinopathy, macular edema presence unspecified, unspecified laterality, unspecified retinopathy severity ([place])  Vitamin D deficiency   Continue Ocrevus.  At next visit, check CBC/D and IgG/IgM Around time of next visit, will check MRI of the brain w/wo to determine if any subclinical progression and consider a different DMT if occurring.   Note for work for day off for infusions. RTC 6 months, sooner if new or worsening issues.     Khale Nigh A. Felecia Shelling, MD, [facility] 10/13/2583, 27:78 AM Certified in Neurology, Clinical Neurophysiology, Sleep Medicine and Neuroimaging  [facility] [address] [place], [place] [address] [phone]

## 2021-07-01 LAB — CBC WITH DIFFERENTIAL/PLATELET
Basophils Absolute: 0 10*3/uL (ref 0.0–0.2)
Basos: 1 %
EOS (ABSOLUTE): 0.1 10*3/uL (ref 0.0–0.4)
Eos: 2 %
Hematocrit: 35 % (ref 34.0–46.6)
Hemoglobin: 11.5 g/dL (ref 11.1–15.9)
Immature Grans (Abs): 0 10*3/uL (ref 0.0–0.1)
Immature Granulocytes: 0 %
Lymphocytes Absolute: 1 10*3/uL (ref 0.7–3.1)
Lymphs: 25 %
MCH: 28.6 pg (ref 26.6–33.0)
MCHC: 32.9 g/dL (ref 31.5–35.7)
MCV: 87 fL (ref 79–97)
Monocytes Absolute: 0.4 10*3/uL (ref 0.1–0.9)
Monocytes: 9 %
Neutrophils Absolute: 2.6 10*3/uL (ref 1.4–7.0)
Neutrophils: 63 %
Platelets: 381 10*3/uL (ref 150–450)
RBC: 4.02 x10E6/uL (ref 3.77–5.28)
RDW: 12.5 % (ref 11.7–15.4)
WBC: 4.1 10*3/uL (ref 3.4–10.8)

## 2021-07-01 LAB — IGG, IGA, IGM
IgA/Immunoglobulin A, Serum: 242 mg/dL (ref 87–352)
IgG (Immunoglobin G), Serum: 1305 mg/dL (ref 586–1602)
IgM (Immunoglobulin M), Srm: 133 mg/dL (ref 26–217)

## 2021-07-29 ENCOUNTER — Encounter: Payer: Self-pay | Admitting: Neurology

## 2021-08-11 DIAGNOSIS — Z0289 Encounter for other administrative examinations: Secondary | ICD-10-CM

## 2021-08-18 NOTE — Telephone Encounter (Signed)
Gave completed/signed FMLA form back to medical records to process for pt. 

## 2021-08-23 ENCOUNTER — Other Ambulatory Visit: Payer: Self-pay

## 2021-08-23 ENCOUNTER — Encounter: Payer: Self-pay | Admitting: Emergency Medicine

## 2021-08-23 ENCOUNTER — Emergency Department
Admission: EM | Admit: 2021-08-23 | Discharge: 2021-08-23 | Disposition: A | Discharge status: Home / Self Care | Payer: BC Managed Care – PPO | Attending: Emergency Medicine | Admitting: Emergency Medicine

## 2021-08-23 DIAGNOSIS — H1031 Unspecified acute conjunctivitis, right eye: Secondary | ICD-10-CM | POA: Diagnosis not present

## 2021-08-23 DIAGNOSIS — E119 Type 2 diabetes mellitus without complications: Secondary | ICD-10-CM | POA: Diagnosis not present

## 2021-08-23 DIAGNOSIS — H5789 Other specified disorders of eye and adnexa: Secondary | ICD-10-CM | POA: Diagnosis present

## 2021-08-23 MED ORDER — OFLOXACIN 0.3 % OP SOLN: 1.0000 [drp] | Freq: Four times a day (QID) | OPHTHALMIC | 0 refills | Status: AC

## 2021-08-23 NOTE — ED Provider Notes (Signed)
? ?[facility] ?Provider Note ? ? ? Event Date/Time  ? First MD Initiated Contact with Patient 08/23/21 1143   ?  (approximate) ? ? ?History  ? ?Eye Pain and Eye Drainage ? ? ?HPI ? ?Jeanne Mosley is a 36 y.o. female with history of MS and diabetes who presents with right eye redness since yesterday, associated with yellowish discharge this morning.  It is mildly painful which gets somewhat worse with bright light, however she denies any changes in her vision.  She denies swelling around the eye.  She has no runny nose, cough, or fever.  She denies headache.  She denies any injury to the eye or any foreign body sensation.  The left eye is unaffected. ? ? ? ?Physical Exam  ? ?Triage Vital Signs: ?ED Triage Vitals  ?Enc Vitals Group  ?   BP 08/23/21 1115 128/89  ?   Pulse Rate 08/23/21 1115 88  ?   Resp 08/23/21 1115 17  ?   Temp 08/23/21 1115 98.6 ?F (37 ?C)  ?   Temp Source 08/23/21 1115 Oral  ?   SpO2 08/23/21 1115 100 %  ?   Weight 08/23/21 1116 157 lb (71.2 kg)  ?   Height 08/23/21 1116 5\' 2"  (1.575 m)  ?   Head Circumference --   ?   Peak Flow --   ?   Pain Score 08/23/21 1115 5  ?   Pain Loc --   ?   Pain Edu? --   ?   Excl. in GC? --   ? ? ?Most recent vital signs: ?Vitals:  ? 08/23/21 1115  ?BP: 128/89  ?Pulse: 88  ?Resp: 17  ?Temp: 98.6 ?F (37 ?C)  ?SpO2: 100%  ? ? ? ?General: Awake, no distress.  ?CV:  Good peripheral perfusion.  ?Resp:  Normal effort.  ?Abd:  No distention.  ?Other:  EOMI.  PERRLA.  No photophobia.  Right eye mildly injected with no chemosis or periorbital swelling.  Small amount of clear yellowish discharge. ? ? ?ED Results / Procedures / Treatments  ? ?Labs ?(all labs ordered are listed, but only abnormal results are displayed) ?Labs Reviewed - No data to display ? ? ?EKG ? ? ? ? ?RADIOLOGY ? ? ? ?PROCEDURES: ? ?Critical Care performed: No ? ?Procedures ? ? ?MEDICATIONS ORDERED IN ED: ?Medications - No data to display ? ? ?IMPRESSION / MDM / ASSESSMENT AND PLAN  / ED COURSE  ?I reviewed the triage vital signs and the nursing notes. ? ?36 year old female with PMH as noted above presents with right eye redness, discharge, and discomfort since yesterday. ? ?Exam reveals injected conjunctiva with mild discharge and no photophobia or swelling.  There is no periorbital swelling or erythema. ? ?Presentation is consistent with conjunctivitis.  Given that it is unilateral and has discharge we will treat with antibiotics eyedrops for bacterial etiology.  There is no evidence of uveitis or iritis.  There is no evidence of corneal abrasion or foreign body. ? ?I counseled the patient on the results of the exam and the plan of care.  Return precautions given, and she expresses understanding. ? ? ?FINAL CLINICAL IMPRESSION(S) / ED DIAGNOSES  ? ?Final diagnoses:  ?Acute conjunctivitis of right eye, unspecified acute conjunctivitis type  ? ? ? ?Rx / DC Orders  ? ?ED Discharge Orders   ? ?      Ordered  ?  ofloxacin (OCUFLOX) 0.3 % ophthalmic solution  4 times daily       ?  08/23/21 1256  ? ?  ?  ? ?  ? ? ? ?Note:  This document was prepared using Dragon voice recognition software and may include unintentional dictation errors.  ?  Dionne Bucy, MD ?08/23/21 1510 ? ?

## 2021-08-23 NOTE — ED Notes (Signed)
See triage note  presents with right eye pain and drainage which started yesterday   ?

## 2021-08-23 NOTE — Discharge Instructions (Signed)
Use the eyedrops as prescribed for the next week.  If you have any continued symptoms you may follow-up with the eye doctor.  Return to the ER immediately for new, worsening, or persistent severe eye pain, redness, swelling, discharge, any vision changes, swelling around the eye, fever or chills, or any other new or worsening symptoms that concern you. ?

## 2021-08-23 NOTE — ED Triage Notes (Addendum)
Pt to the ED via POV with c/o right eye having redness, pain and drainage. It was bothering her yesterday she prayed about it got better, then it got worse, she put refresh drops in it this am. It is light sensitive. No known injury to her eye ?

## 2021-12-29 NOTE — Patient Instructions (Addendum)
Below is our plan:  We will continue Ocrevus every 6 months. I will update labs, today.   Please make sure you are staying well hydrated. I recommend 50-60 ounces daily. Well balanced diet and regular exercise encouraged. Consistent sleep schedule with 6-8 hours recommended.   Please continue follow up with care team as directed.   Follow up with Dr Epimenio Foot in 6 months  You may receive a survey regarding today's visit. I encourage you to leave honest feed back as I do use this information to improve patient care. Thank you for seeing me today!   Management of Memory Problems   There are some general things you can do to help manage your memory problems.  Your memory may not in fact recover, but by using techniques and strategies you will be able to manage your memory difficulties better.   1)  Establish a routine. Try to establish and then stick to a regular routine.  By doing this, you will get used to what to expect and you will reduce the need to rely on your memory.  Also, try to do things at the same time of day, such as taking your medication or checking your calendar first thing in the morning. Think about think that you can do as a part of a regular routine and make a list.  Then enter them into a daily planner to remind you.  This will help you establish a routine.   2)  Organize your environment. Organize your environment so that it is uncluttered.  Decrease visual stimulation.  Place everyday items such as keys or cell phone in the same place every day (ie.  Basket next to front door) Use post it notes with a brief message to yourself (ie. Turn off light, lock the door) Use labels to indicate where things go (ie. Which cupboards are for food, dishes, etc.) Keep a notepad and pen by the telephone to take messages   3)  Memory Aids A diary or journal/notebook/daily planner Making a list (shopping list, chore list, to do list that needs to be done) Using an alarm as a reminder  (kitchen timer or cell phone alarm) Using cell phone to store information (Notes, Calendar, Reminders) Calendar/White board placed in a prominent position Post-it notes   In order for memory aids to be useful, you need to have good habits.  It's no good remembering to make a note in your journal if you don't remember to look in it.  Try setting aside a certain time of day to look in journal.   4)  Improving mood and managing fatigue. There may be other factors that contribute to memory difficulties.  Factors, such as anxiety, depression and tiredness can affect memory. Regular gentle exercise can help improve your mood and give you more energy. Simple relaxation techniques may help relieve symptoms of anxiety Try to get back to completing activities or hobbies you enjoyed doing in the past. Learn to pace yourself through activities to decrease fatigue. Find out about some local support groups where you can share experiences with others. Try and achieve 7-8 hours of sleep at night.

## 2021-12-29 NOTE — Progress Notes (Signed)
Chief Complaint  Patient presents with   Follow-up    Rm 2, alone. Here for 6 month MS f/u, on Ocrevus and tolerating well. MS stable. No new or worsening in sx. Pt would like to discuss memory loss with MS.    HISTORY OF PRESENT ILLNESS:  01/02/22 ALL:  Jeanne Mosley is a 36 y.o. female here today for follow up for RRMS. She continues Ocrevus infusions. Previously on Vumerity but couldn't afford it and off therapy for a few months. MRI brian 01/2021 showed 4 acute demyelinating plaques and mild generalized atrophy. MRI cervical spine showed old lesions of multiple levels and mild degenerative changes and spinal stenosis.   She is doing well, today. No new or exacerbating symptoms. She feels gait is stable. No difficulty walking. No assistive device. She was exercising daily but hasn't recently.   She is sleeping well. Mood good. She does have some concerns of memory loss. She feels that she has a hard time remembering conversations and parts of her day. She continues vitamin D supplements daily. CBGs are controlled. Last A1C was 6.3.    HISTORY (copied from Dr Bonnita Hollow previous note)  Jeanne Mosley is a 35 y.o. woman with RRMS   Update 06/30/2021. She started Ocrevus and tolerated it well. She notes no exacerbaions or new MS symptoms.     Currently, she is walking fairly well and does not need support.  She  as had only a few stumbles.   She can walk a mile without rest.   She uses the bannister on some stairs bu not the one at her home.   She has no major weakness and no spasticity.  .   She notes numbness in the fingertips.     Vision is stable.   She has diabetic retinopathy.     She denies diplopia.   Bowel and bladder are mostly fine with occasional urgency but no incontinence.    She has mild fatigue, better since supplementing VIt D (50,000 U weekly).   She sleeps poorly some nights more due to sleep maintenance issues.   Mood is doing well.   She notes no cognitive  issues.      She was diagnosed with Type 1 IDDM at age 61 momths.  Sugars are usually well controlled.   She has diabetic retinopathy but no nephropathy or neuropathy.     MS HIstory: She was diagnosed with MS in early 2015.   In 2014 after she had weakness in the right hip and had a few episodes of fecal incontinence.   She then noted numbness in her hands (bilateral).  She had imaging studies that were consistent with MS. she also had a lumbar puncture and the CSF was reportedly consistent with MS.  She moved back to [place] and saw a Insurance account manager in [place].   She was started on Tecfidera.    She did well on it.   She had to change to Vumerity for co-pay assistance but had insurance issues with it and has been without medication for the past couple months.   She has had a few relapses with sensory symptoms.   She had no serious exacerbation and had no steroid doses.      Imaging: MRI report 07/02/2018 showed Numerous supratentorial brain lesions consistent the patient's history of demyelinating disease. Two lesions within the frontal lobes demonstrate thin peripheral enhancement concerning for areas of active demyelination.   MRI 01/27/2021 showed Multiple T2/FLAIR hyperintense foci in  the hemispheres, thalamus and spinal cord in a pattern and configuration consistent with demyelinating plaque associated with multiple sclerosis.  4 of the foci enhance with contrast and are consistent with acute demyelinating plaques.  MRI cervical spine 01/27/2021 showed   T2 hyperintense foci within the spinal cord as detailed above.  These are located at the cervicomedullary junction, and adjacent to C2, C2-C3, C4, C4-C5 and T1.  None of the foci enhance. Mild spinal stenosis at C3-C4 C4-C5 and borderline spinal stenosis at C5-C6 due to mild degenerative changes as detailed above.  There is no nerve root compression at these or other levels.  Laboratory: 08/23/2020 ESR, CRP, C4, C3, TSH, CBC with differential were  normal.  Glucose was elevated.  Hemoglobin A1c was 6.8.  12/23/2020:  Hep B, Hep C, TB all non-reactive.   VZV Ab is immune.     REVIEW OF SYSTEMS: Out of a complete 14 system review of symptoms, the patient complains only of the following symptoms, memory loss, fatigue and all other reviewed systems are negative.   ALLERGIES: Allergies  Allergen Reactions   Sulfamethoxazole-Trimethoprim Hives and Itching   Aspartame Other (See Comments)   Trimethoprim Other (See Comments)   Sulfa Antibiotics Hives    hives   Sweetness Enhancer Rash     HOME MEDICATIONS: Outpatient Medications Prior to Visit  Medication Sig Dispense Refill   ibuprofen (ADVIL) 600 MG tablet Take 1 tablet (600 mg total) by mouth every 6 (six) hours as needed. 30 tablet 0   insulin aspart (NOVOLOG) 100 UNIT/ML injection Insulin pump 10 mL 0   Needles & Syringes MISC Please give insulin syringes and needles. 90 each 0   Vitamin D, Ergocalciferol, (DRISDOL) 1.25 MG (50000 UNIT) CAPS capsule Take 1 capsule (50,000 Units total) by mouth every 7 (seven) days. 13 capsule 3   No facility-administered medications prior to visit.     PAST MEDICAL HISTORY: Past Medical History:  Diagnosis Date   Cervical dysplasia    Depression    DM (diabetes mellitus), type 1 (HCC)    MS (multiple sclerosis) (HCC)    UTI (urinary tract infection)      PAST SURGICAL HISTORY: Past Surgical History:  Procedure Laterality Date   CERVIX LESION DESTRUCTION     CESAREAN SECTION     EYE SURGERY     x4     FAMILY HISTORY: Family History  Problem Relation Age of Onset   Cataracts Mother      SOCIAL HISTORY: Social History   Socioeconomic History   Marital status: Divorced    Spouse name: Not on file   Number of children: 1   Years of education: Not on file   Highest education level: Bachelor's degree (e.g., BA, AB, BS)  Occupational History   Not on file  Tobacco Use   Smoking status: Never   Smokeless tobacco:  Never  Vaping Use   Vaping Use: Never used  Substance and Sexual Activity   Alcohol use: Yes   Drug use: No   Sexual activity: Not on file  Other Topics Concern   Not on file  Social History Narrative   Lives with son   Right handed   Caffeine: 4-5 caffeine drinks a day   Social Determinants of Health   Financial Resource Strain: Not on file  Food Insecurity: Food Insecurity Present (01/05/2020)   Hunger Vital Sign    Worried About [place] in the Last Year: Sometimes true    Ran  Out of Food in the Last Year: Sometimes true  Transportation Needs: No Transportation Needs (01/05/2020)   PRAPARE - Hydrologist (Medical): No    Lack of Transportation (Non-Medical): No  Physical Activity: Not on file  Stress: Not on file  Social Connections: Not on file  Intimate Partner Violence: Not on file     PHYSICAL EXAM  Vitals:   01/02/22 1029  BP: 119/82  Pulse: 79  Weight: 157 lb (71.2 kg)  Height: $Remove'5\' 2"'FbCgkCM$  (1.575 m)   Body mass index is 28.72 kg/m.  Generalized: Well developed, in no acute distress  Cardiology: normal rate and rhythm, no murmur auscultated  Respiratory: clear to auscultation bilaterally    Neurological examination  Mentation: Alert oriented to time, place, history taking. Follows all commands speech and language fluent Cranial nerve II-XII: Pupils were equal round reactive to light. Extraocular movements were full, visual field were full on confrontational test. Facial sensation and strength were normal. Head turning and shoulder shrug  were normal and symmetric. Motor: The motor testing reveals 5 over 5 strength of all 4 extremities. Good symmetric motor tone is noted throughout.  Sensory: Sensory testing is intact to soft touch on all 4 extremities. No evidence of extinction is noted.  Coordination: Cerebellar testing reveals good finger-nose-finger and heel-to-shin bilaterally.  Gait and station: Gait is normal.   Reflexes: Deep tendon reflexes are symmetric and normal bilaterally.    DIAGNOSTIC DATA (LABS, IMAGING, TESTING) - I reviewed patient records, labs, notes, testing and imaging myself where available.  Lab Results  Component Value Date   WBC 4.1 06/30/2021   HGB 11.5 06/30/2021   HCT 35.0 06/30/2021   MCV 87 06/30/2021   PLT 381 06/30/2021      Component Value Date/Time   NA 144 12/23/2020 1013   K 4.2 12/23/2020 1013   CL 103 12/23/2020 1013   CO2 23 12/23/2020 1013   GLUCOSE 96 12/23/2020 1013   GLUCOSE 124 (H) 11/12/2020 1517   BUN 6 12/23/2020 1013   CREATININE 0.79 12/23/2020 1013   CALCIUM 9.2 12/23/2020 1013   PROT 7.4 12/23/2020 1013   ALBUMIN 4.4 12/23/2020 1013   AST 17 12/23/2020 1013   ALT 16 12/23/2020 1013   ALKPHOS 62 12/23/2020 1013   BILITOT 0.2 12/23/2020 1013   GFRNONAA >60 11/12/2020 1517   GFRAA >60 10/11/2019 2217   No results found for: "CHOL", "HDL", "LDLCALC", "LDLDIRECT", "TRIG", "CHOLHDL" No results found for: "HGBA1C" No results found for: "VITAMINB12" No results found for: "TSH"      No data to display               No data to display           ASSESSMENT AND PLAN  36 y.o. year old female  has a past medical history of Cervical dysplasia, Depression, DM (diabetes mellitus), type 1 ([place]), MS (multiple sclerosis) ([place]), and UTI (urinary tract infection). here with    Relapsing remitting multiple sclerosis ([place]) - Plan: CBC with Differential/Platelets, IgG, IgA, IgM  Type 1 diabetes mellitus with retinopathy, macular edema presence unspecified, unspecified laterality, unspecified retinopathy severity (HCC)  High risk medication use - Plan: CBC with Differential/Platelets, IgG, IgA, IgM  Vitamin D deficiency - Plan: Vitamin D, 25-hydroxy  Memory loss - Plan: Vitamin Q65  Jeanne Mosley is doing well, today. She will continue Ocrevus infusions every 6 months. We will update labs. Will check vitamin D and B12 for  fatigue and memory loss concerns. She was encouraged to add regular physical and mental exercise. Memory compensation strategies provided in AVS. Good glycemic control encouraged. May consider attention deficit eval. Healthy lifestyle habits encouraged. She will follow up with PCP as directed. She will return to see Dr Felecia Shelling in 6 months, sooner if needed. She verbalizes understanding and agreement with this plan.   Orders Placed This Encounter  Procedures   CBC with Differential/Platelets   IgG, IgA, IgM   Vitamin D, 25-hydroxy   Vitamin B12     No orders of the defined types were placed in this encounter.    Debbora Presto, MSN, FNP-C 01/02/2022, 11:04 AM  [facility] [address], [place], [place] [address] [phone]

## 2022-01-02 ENCOUNTER — Ambulatory Visit (INDEPENDENT_AMBULATORY_CARE_PROVIDER_SITE_OTHER): Payer: BC Managed Care – PPO | Admitting: Family Medicine

## 2022-01-02 ENCOUNTER — Encounter: Payer: Self-pay | Admitting: Family Medicine

## 2022-01-02 VITALS — BP 119/82 | HR 79 | Ht 62.0 in | Wt 157.0 lb

## 2022-01-02 DIAGNOSIS — Z79899 Other long term (current) drug therapy: Secondary | ICD-10-CM

## 2022-01-02 DIAGNOSIS — R269 Unspecified abnormalities of gait and mobility: Secondary | ICD-10-CM

## 2022-01-02 DIAGNOSIS — G35 Multiple sclerosis: Secondary | ICD-10-CM

## 2022-01-02 DIAGNOSIS — E10319 Type 1 diabetes mellitus with unspecified diabetic retinopathy without macular edema: Secondary | ICD-10-CM

## 2022-01-02 DIAGNOSIS — E559 Vitamin D deficiency, unspecified: Secondary | ICD-10-CM | POA: Diagnosis not present

## 2022-01-02 DIAGNOSIS — R413 Other amnesia: Secondary | ICD-10-CM

## 2022-01-03 ENCOUNTER — Other Ambulatory Visit: Payer: Self-pay | Admitting: Family Medicine

## 2022-01-03 DIAGNOSIS — E559 Vitamin D deficiency, unspecified: Secondary | ICD-10-CM

## 2022-01-03 LAB — CBC WITH DIFFERENTIAL/PLATELET
Basophils Absolute: 0 10*3/uL (ref 0.0–0.2)
Basos: 1 %
EOS (ABSOLUTE): 0.1 10*3/uL (ref 0.0–0.4)
Eos: 1 %
Hematocrit: 40.6 % (ref 34.0–46.6)
Hemoglobin: 13 g/dL (ref 11.1–15.9)
Immature Grans (Abs): 0 10*3/uL (ref 0.0–0.1)
Immature Granulocytes: 0 %
Lymphocytes Absolute: 1.2 10*3/uL (ref 0.7–3.1)
Lymphs: 29 %
MCH: 27.5 pg (ref 26.6–33.0)
MCHC: 32 g/dL (ref 31.5–35.7)
MCV: 86 fL (ref 79–97)
Monocytes Absolute: 0.3 10*3/uL (ref 0.1–0.9)
Monocytes: 7 %
Neutrophils Absolute: 2.6 10*3/uL (ref 1.4–7.0)
Neutrophils: 62 %
Platelets: 342 10*3/uL (ref 150–450)
RBC: 4.72 x10E6/uL (ref 3.77–5.28)
RDW: 13.2 % (ref 11.7–15.4)
WBC: 4.3 10*3/uL (ref 3.4–10.8)

## 2022-01-03 LAB — VITAMIN B12: Vitamin B-12: 346 pg/mL (ref 232–1245)

## 2022-01-03 LAB — IGG, IGA, IGM
IgA/Immunoglobulin A, Serum: 243 mg/dL (ref 87–352)
IgG (Immunoglobin G), Serum: 1338 mg/dL (ref 586–1602)
IgM (Immunoglobulin M), Srm: 145 mg/dL (ref 26–217)

## 2022-01-03 LAB — VITAMIN D 25 HYDROXY (VIT D DEFICIENCY, FRACTURES): Vit D, 25-Hydroxy: 14.7 ng/mL — ABNORMAL LOW (ref 30.0–100.0)

## 2022-01-03 MED ORDER — VITAMIN D (ERGOCALCIFEROL) 1.25 MG (50000 UNIT) PO CAPS: 50000.0000 [IU] | ORAL_CAPSULE | ORAL | 3 refills | Status: DC

## 2022-07-05 ENCOUNTER — Ambulatory Visit (INDEPENDENT_AMBULATORY_CARE_PROVIDER_SITE_OTHER): Payer: No Typology Code available for payment source | Admitting: Neurology

## 2022-07-05 ENCOUNTER — Encounter: Payer: Self-pay | Admitting: Neurology

## 2022-07-05 VITALS — BP 125/81 | HR 89 | Ht 62.0 in | Wt 161.0 lb

## 2022-07-05 DIAGNOSIS — R269 Unspecified abnormalities of gait and mobility: Secondary | ICD-10-CM | POA: Insufficient documentation

## 2022-07-05 DIAGNOSIS — G35 Multiple sclerosis: Secondary | ICD-10-CM | POA: Diagnosis not present

## 2022-07-05 DIAGNOSIS — F32A Depression, unspecified: Secondary | ICD-10-CM | POA: Diagnosis not present

## 2022-07-05 DIAGNOSIS — Z79899 Other long term (current) drug therapy: Secondary | ICD-10-CM | POA: Diagnosis not present

## 2022-07-05 DIAGNOSIS — G44219 Episodic tension-type headache, not intractable: Secondary | ICD-10-CM

## 2022-07-05 DIAGNOSIS — E559 Vitamin D deficiency, unspecified: Secondary | ICD-10-CM | POA: Insufficient documentation

## 2022-07-05 DIAGNOSIS — E10319 Type 1 diabetes mellitus with unspecified diabetic retinopathy without macular edema: Secondary | ICD-10-CM

## 2022-07-05 MED ORDER — INDOMETHACIN 25 MG PO CAPS: ORAL_CAPSULE | ORAL | 2 refills | Status: DC

## 2022-07-05 NOTE — Progress Notes (Signed)
[facility]  PATIENT: Jeanne Mosley DOB: 1986/02/13  REFERRING DOCTOR OR PCP: Penny Pia, MD (neurology); Harrison Mons, PA-C (PCP) SOURCE: Notes from Dr. Sabra Heck, imaging and lab reports from [place].  _________________________________   HISTORICAL  CHIEF COMPLAINT:  Chief Complaint  Patient presents with   Room 10    Pt is here Alone. Pt states that she has been having more headaches. Pt states that she has muscle weakness and tingling in her hands and legs.     HISTORY OF PRESENT ILLNESS:  Jeanne Mosley is a 37 y.o. woman with RRMS   Update 06/30/2021. She started Ocrevus and tolerated it well.   Her last infusion was October 2023.     She notes no exacerbaions or new MS symptoms.     Currently, she is walking fairly well and does not need support. She has some stumbles but no falls as gait is mildly off balance..   She can walk a mile without rest.   She uses the bannister downstairs.  She does well in the shower.  .   She has no major weakness and no spasticity.  .   She notes numbness in the fingertips.     Vision is a little blurry but she has diabetic retinopathy.   This is stable.   She sees ophthalmology 2-3 times a years.     She denies diplopia.   Bowel and bladder are mostly fine with occasional urgency but no incontinence.    She has mild fatigue, better since supplementing VIt D (50,000 U weekly).   She sleeps poorly some nights more due to sleep maintenance issues.   She has some depression but prefers not to start a medication.  She saw a counselor for a few visits..  She notes no cognitive issues.      She was diagnosed with Type 1 IDDM at age 37 momths.  Sugars are usually well controlled.   She has diabetic retinopathy but no nephropathy or neuropathy.     She has headaches occurring about 2 times a month for 1/2 day.   Pain starts dull and then intensifies.   No vomiting or photophobia    Pain is bifrontal/bitemporal.    Moving  head doe snot change the pain.  MS HIstory: She was diagnosed with MS in early 2015.   In 2014 after she had weakness in the right hip and had a few episodes of fecal incontinence.   She then noted numbness in her hands (bilateral).  She had imaging studies that were consistent with MS. she also had a lumbar puncture and the CSF was reportedly consistent with MS.  She moved back to [place] and saw a Garment/textile technologist in [place].   She was started on Tecfidera.    She did well on it.   She had to change to Vumerity for co-pay assistance but had insurance issues with it and has been without medication for the past couple months.   She has had a few relapses with sensory symptoms.   She had no serious exacerbation and had no steroid doses.        Imaging: MRI report 07/02/2018 showed Numerous supratentorial brain lesions consistent the patient's history of demyelinating disease. Two lesions within the frontal lobes demonstrate thin peripheral enhancement concerning for areas of active demyelination.   MRI 01/27/2021 showed Multiple T2/FLAIR hyperintense foci in the hemispheres, thalamus and spinal cord in a pattern and configuration consistent with demyelinating plaque associated with  multiple sclerosis.  4 of the foci enhance with contrast and are consistent with acute demyelinating plaques.  MRI cervical spine 01/27/2021 showed   T2 hyperintense foci within the spinal cord as detailed above.  These are located at the cervicomedullary junction, and adjacent to C2, C2-C3, C4, C4-C5 and T1.  None of the foci enhance. Mild spinal stenosis at C3-C4 C4-C5 and borderline spinal stenosis at C5-C6 due to mild degenerative changes as detailed above.  There is no nerve root compression at these or other levels.  Laboratory: 08/23/2020 ESR, CRP, C4, C3, TSH, CBC with differential were normal.  Glucose was elevated.  Hemoglobin A1c was 6.8.  12/23/2020:  Hep B, Hep C, TB all non-reactive.   VZV Ab is immune.    REVIEW OF  SYSTEMS: Constitutional: No fevers, chills, sweats, or change in appetite Eyes: No visual changes, double vision, eye pain Ear, nose and throat: No hearing loss, ear pain, nasal congestion, sore throat Cardiovascular: No chest pain, palpitations Respiratory:  No shortness of breath at rest or with exertion.   No wheezes GastrointestinaI: No nausea, vomiting, diarrhea, abdominal pain, fecal incontinence Genitourinary:  No dysuria, urinary retention or frequency.  No nocturia. Musculoskeletal:  No neck pain, back pain Integumentary: No rash, pruritus, skin lesions Neurological: as above Psychiatric: No depression at this time.  No anxiety Endocrine: No palpitations, diaphoresis, change in appetite, change in weigh or increased thirst Hematologic/Lymphatic:  No anemia, purpura, petechiae. Allergic/Immunologic: No itchy/runny eyes, nasal congestion, recent allergic reactions, rashes  ALLERGIES: Allergies  Allergen Reactions   Sulfamethoxazole-Trimethoprim Hives and Itching   Aspartame Other (See Comments)   Trimethoprim Other (See Comments)   Sulfa Antibiotics Hives    hives   Sweetness Enhancer Rash    HOME MEDICATIONS:  Current Outpatient Medications:    indomethacin (INDOCIN) 25 MG capsule, Take one po prn headache.   No more than 1 a day or 3 a week, Disp: 12 capsule, Rfl: 2   insulin aspart (NOVOLOG) 100 UNIT/ML injection, Insulin pump, Disp: 10 mL, Rfl: 0   Vitamin D, Ergocalciferol, (DRISDOL) 1.25 MG (50000 UNIT) CAPS capsule, Take 1 capsule (50,000 Units total) by mouth every 7 (seven) days., Disp: 13 capsule, Rfl: 3   Needles & Syringes MISC, Please give insulin syringes and needles. (Patient not taking: Reported on 07/05/2022), Disp: 90 each, Rfl: 0  PAST MEDICAL HISTORY: Past Medical History:  Diagnosis Date   Cervical dysplasia    Depression    DM (diabetes mellitus), type 1 ([place])    MS (multiple sclerosis) ([place])    UTI (urinary tract infection)     PAST SURGICAL  HISTORY: Past Surgical History:  Procedure Laterality Date   CERVIX LESION DESTRUCTION     CESAREAN SECTION     EYE SURGERY     x4    FAMILY HISTORY: Family History  Problem Relation Age of Onset   Cataracts Mother     SOCIAL HISTORY:  Social History   Socioeconomic History   Marital status: Divorced    Spouse name: Not on file   Number of children: 1   Years of education: Not on file   Highest education level: Bachelor's degree (e.g., BA, AB, BS)  Occupational History   Not on file  Tobacco Use   Smoking status: Never   Smokeless tobacco: Never  Vaping Use   Vaping Use: Never used  Substance and Sexual Activity   Alcohol use: Yes   Drug use: No   Sexual activity: Not on  file  Other Topics Concern   Not on file  Social History Narrative   Lives with son   Right handed   Caffeine: 4-5 caffeine drinks a day   Social Determinants of Health   Financial Resource Strain: Not on file  Food Insecurity: Food Insecurity Present (01/05/2020)   Hunger Vital Sign    Worried About Running Out of Food in the Last Year: Sometimes true    Ran Out of Food in the Last Year: Sometimes true  Transportation Needs: No Transportation Needs (01/05/2020)   PRAPARE - Administrator, Civil Service (Medical): No    Lack of Transportation (Non-Medical): No  Physical Activity: Not on file  Stress: Not on file  Social Connections: Not on file  Intimate Partner Violence: Not on file     PHYSICAL EXAM  Vitals:   07/05/22 1107  BP: 125/81  Pulse: 89  Weight: 161 lb (73 kg)  Height: 5\' 2"  (1.575 m)     Body mass index is 29.45 kg/m.   General: The patient is well-developed and well-nourished and in no acute distress  HEENT:  Head is [place]/AT.  Sclera are anicteric.   Skin: Extremities are without rash or  edema.   Neurologic Exam  Mental status: The patient is alert and oriented x 3 at the time of the examination. The patient has apparent normal recent and remote  memory, with an apparently normal attention span and concentration ability.   Speech is normal.  Cranial nerves: Extraocular movements are full..  Normal facial strength and sensation No obvious hearing deficits are noted.  Motor:  Muscle bulk is normal.   Tone is normal. Strength is  5 / 5 in all 4 extremities.   Sensory: Sensory testing is intact to pinprick, soft touch and vibration sensation in arms and proximal legs but reduced vibration at toes    Coordination: Cerebellar testing reveals good finger-nose-finger and heel-to-shin bilaterally.  Gait and station: Station is normal.   The gait was normal but the tandem gait was wide.  Romberg is negative.     Reflexes: Deep tendon reflexes are symmetric and normal bilaterally.       DIAGNOSTIC DATA (LABS, IMAGING, TESTING) - I reviewed patient records, labs, notes, testing and imaging myself where available.  Lab Results  Component Value Date   WBC 4.3 01/02/2022   HGB 13.0 01/02/2022   HCT 40.6 01/02/2022   MCV 86 01/02/2022   PLT 342 01/02/2022      Component Value Date/Time   NA 144 12/23/2020 1013   K 4.2 12/23/2020 1013   CL 103 12/23/2020 1013   CO2 23 12/23/2020 1013   GLUCOSE 96 12/23/2020 1013   GLUCOSE 124 (H) 11/12/2020 1517   BUN 6 12/23/2020 1013   CREATININE 0.79 12/23/2020 1013   CALCIUM 9.2 12/23/2020 1013   PROT 7.4 12/23/2020 1013   ALBUMIN 4.4 12/23/2020 1013   AST 17 12/23/2020 1013   ALT 16 12/23/2020 1013   ALKPHOS 62 12/23/2020 1013   BILITOT 0.2 12/23/2020 1013   GFRNONAA >60 11/12/2020 1517   GFRAA >60 10/11/2019 2217       ASSESSMENT AND PLAN  Multiple sclerosis (HCC) - Plan: CBC with Differential/Platelet, IgG, IgA, IgM, MR BRAIN WO CONTRAST  Depression, unspecified depression type  High risk medication use - Plan: CBC with Differential/Platelet, IgG, IgA, IgM  Vitamin D deficiency - Plan: VITAMIN D 25 Hydroxy (Vit-D Deficiency, Fractures)  Gait disturbance  Type 1 diabetes  mellitus with retinopathy, macular edema presence unspecified, unspecified laterality, unspecified retinopathy severity ([place])   Continue Ocrevus. Check CBC/D and IgG/IgM today. Check MRI of the brain (will do without contrast as she has diabetes) to determine if any subclinical progression and consider a different DMT if occurring.   Her headaches are likely tension type headaches as they do not have migrainous features.  I sent in a prescription for indomethacin to take when the headache occurs.  These are currently occurring just twice a month.  She is advised to use the indomethacin sparingly as she could be at higher renal risk due to her diabetes.  She should not combine with ibuprofen Vitamin D has been low.  She has been on supplementation and we will check to see if she still requires high-dose.   RTC 6 months, sooner if new or worsening issues.     Jeanne Herrle A. Felecia Shelling, MD, [facility] 01/15/7516, 00:17 AM Certified in Neurology, Clinical Neurophysiology, Sleep Medicine and Neuroimaging  [facility] [address], [place], [place] [address] [phone]

## 2022-07-06 LAB — CBC WITH DIFFERENTIAL/PLATELET
Basophils Absolute: 0 10*3/uL (ref 0.0–0.2)
Basos: 1 %
EOS (ABSOLUTE): 0 10*3/uL (ref 0.0–0.4)
Eos: 1 %
Hematocrit: 36.3 % (ref 34.0–46.6)
Hemoglobin: 11.7 g/dL (ref 11.1–15.9)
Immature Grans (Abs): 0 10*3/uL (ref 0.0–0.1)
Immature Granulocytes: 0 %
Lymphocytes Absolute: 1.3 10*3/uL (ref 0.7–3.1)
Lymphs: 22 %
MCH: 27.9 pg (ref 26.6–33.0)
MCHC: 32.2 g/dL (ref 31.5–35.7)
MCV: 86 fL (ref 79–97)
Monocytes Absolute: 0.6 10*3/uL (ref 0.1–0.9)
Monocytes: 10 %
Neutrophils Absolute: 4 10*3/uL (ref 1.4–7.0)
Neutrophils: 66 %
Platelets: 384 10*3/uL (ref 150–450)
RBC: 4.2 x10E6/uL (ref 3.77–5.28)
RDW: 13 % (ref 11.7–15.4)
WBC: 5.9 10*3/uL (ref 3.4–10.8)

## 2022-07-06 LAB — VITAMIN D 25 HYDROXY (VIT D DEFICIENCY, FRACTURES): Vit D, 25-Hydroxy: 44.8 ng/mL (ref 30.0–100.0)

## 2022-07-06 LAB — IGG, IGA, IGM
IgA/Immunoglobulin A, Serum: 233 mg/dL (ref 87–352)
IgG (Immunoglobin G), Serum: 1278 mg/dL (ref 586–1602)
IgM (Immunoglobulin M), Srm: 130 mg/dL (ref 26–217)

## 2022-07-12 ENCOUNTER — Telehealth: Payer: Self-pay | Admitting: Neurology

## 2022-07-12 NOTE — Telephone Encounter (Signed)
sent to GI they obatin Jeanne Mosley, Medicaid [employer] auth: [id number] exp. 07/12/2022 - 09/10/2022  [phone]

## 2022-07-24 ENCOUNTER — Encounter: Payer: Self-pay | Admitting: Neurology

## 2022-07-27 ENCOUNTER — Other Ambulatory Visit: Payer: Medicaid Other

## 2022-08-12 ENCOUNTER — Other Ambulatory Visit: Payer: Self-pay

## 2022-08-12 ENCOUNTER — Emergency Department
Admission: EM | Admit: 2022-08-12 | Discharge: 2022-08-12 | Disposition: A | Discharge status: Home / Self Care | Payer: No Typology Code available for payment source | Attending: Emergency Medicine | Admitting: Emergency Medicine

## 2022-08-12 ENCOUNTER — Ambulatory Visit
Admission: RE | Admit: 2022-08-12 | Discharge: 2022-08-12 | Disposition: A | Discharge status: Home / Self Care | Payer: No Typology Code available for payment source | Source: Ambulatory Visit | Attending: Neurology | Admitting: Neurology

## 2022-08-12 DIAGNOSIS — E119 Type 2 diabetes mellitus without complications: Secondary | ICD-10-CM | POA: Insufficient documentation

## 2022-08-12 DIAGNOSIS — Z03823 Encounter for observation for suspected inserted (injected) foreign body ruled out: Secondary | ICD-10-CM | POA: Diagnosis present

## 2022-08-12 DIAGNOSIS — Z0000 Encounter for general adult medical examination without abnormal findings: Secondary | ICD-10-CM

## 2022-08-12 DIAGNOSIS — G35 Multiple sclerosis: Secondary | ICD-10-CM

## 2022-08-12 NOTE — Discharge Instructions (Signed)
As we discussed no foreign body was seen on exam or with ultrasound imaging.  Return to the emergency department should you notice any redness, swelling, pus, fever, otherwise follow-up with your doctor as scheduled.

## 2022-08-12 NOTE — ED Provider Notes (Signed)
[facility] [place] Provider Note    Event Date/Time   First MD Initiated Contact with Patient 08/12/22 1333     (approximate)  History   Chief Complaint: Foreign Body in Skin  HPI  Jeanne Mosley is a 37 y.o. female with a past medical diabetes, MS, presents to the emergency department with concern for possible needle in her abdomen.  According to the patient she uses a Dexcom device, states she tried to apply a new one today to the left abdomen but when she pulled the device away she noted that the needle did not appear to be present she is not sure if the needle was embedded in her skin.  They state to the area that she tried to place the Dexcom device there was a small amount of blood roommate tried to use tweezers to see if there is anything in there but they could not tell so they came to the emergency department.  Physical Exam   Triage Vital Signs: ED Triage Vitals  Enc Vitals Group     BP 08/12/22 1306 (!) 126/91     Pulse Rate 08/12/22 1306 81     Resp 08/12/22 1306 16     Temp 08/12/22 1306 98.4 F (36.9 C)     Temp Source 08/12/22 1306 Oral     SpO2 08/12/22 1306 99 %     Weight 08/12/22 1307 156 lb (70.8 kg)     Height 08/12/22 1307 '5\' 2"'$  (1.575 m)     Head Circumference --      Peak Flow --      Pain Score 08/12/22 1306 0     Pain Loc --      Pain Edu? --      Excl. in [place]? --     Most recent vital signs: Vitals:   08/12/22 1306  BP: (!) 126/91  Pulse: 81  Resp: 16  Temp: 98.4 F (36.9 C)  SpO2: 99%    General: Awake, no distress.  CV:  Good peripheral perfusion.  Regular rate and rhythm  Resp:  Normal effort.  Equal breath sounds bilaterally.  Abd:  No distention.  Soft, nontender.  No rebound or guarding.  No obvious foreign body palpated.   ED Results / Procedures / Treatments   MEDICATIONS ORDERED IN ED: Medications - No data to display   IMPRESSION / MDM / [place] / ED COURSE  I reviewed the triage vital  signs and the nursing notes.  Patient's presentation is most consistent with acute illness / injury with system symptoms.  Patient presents emergency department for possibility of a needle in her subcu tissue of the left abdomen.  Patient is not entirely sure of the area where she believes that the needle would be but denies any pain.  With deep palpation of the abdominal wall there is no tenderness in any area.  I used a bedside ultrasound and scan the area thoroughly no foreign body no metallic foreign body identified.  Patient reassured by the exam.  Will discharge home I discussed with the patient if she were to develop any redness swelling pus, etc. she should return immediately to the emergency department but as of right now I do not suspect any foreign body within the subcu tissues of the abdomen.  FINAL CLINICAL IMPRESSION(S) / ED DIAGNOSES   Possible foreign body    Note:  This document was prepared using Dragon voice recognition software and may include unintentional dictation errors.  Harvest Dark, MD 08/12/22 1348

## 2022-08-12 NOTE — ED Triage Notes (Signed)
Pt presents via POV c/o insulin needle stuck in abd. Reports type 2 DM.

## 2022-08-28 ENCOUNTER — Other Ambulatory Visit: Payer: Medicaid Other

## 2023-06-26 ENCOUNTER — Ambulatory Visit: Payer: Medicaid Other | Admitting: Podiatry

## 2023-07-10 ENCOUNTER — Ambulatory Visit: Payer: Medicaid Other | Admitting: Podiatry

## 2023-07-10 ENCOUNTER — Encounter: Payer: Self-pay | Admitting: Podiatry

## 2023-07-10 VITALS — Ht 62.0 in | Wt 156.0 lb

## 2023-07-10 DIAGNOSIS — M79674 Pain in right toe(s): Secondary | ICD-10-CM | POA: Diagnosis not present

## 2023-07-10 DIAGNOSIS — B351 Tinea unguium: Secondary | ICD-10-CM | POA: Diagnosis not present

## 2023-07-10 DIAGNOSIS — M79675 Pain in left toe(s): Secondary | ICD-10-CM

## 2023-07-10 NOTE — Progress Notes (Signed)
  Subjective:  Patient ID: Jeanne Mosley, female    DOB: [birth year],  MRN: [id number]  Chief Complaint  Patient presents with   Nail Problem    Pt is here for [facility]   38 y.o. female returns for the above complaint.  Patient presents with thickened elongated dystrophic mycotic toenails x 10 mild pain on palpation hurts with ambulation and pressure she would like for me debride down she is not going to herself denies any other acute complaints  Objective:  There were no vitals filed for this visit. Podiatric Exam: Vascular: dorsalis pedis and posterior tibial pulses are palpable bilateral. Capillary return is immediate. Temperature gradient is WNL. Skin turgor WNL  Sensorium: Normal Semmes Weinstein monofilament test. Normal tactile sensation bilaterally. Nail Exam: Pt has thick disfigured discolored nails with subungual debris noted bilateral entire nail hallux through fifth toenails.  Pain on palpation to the nails. Ulcer Exam: There is no evidence of ulcer or pre-ulcerative changes or infection. Orthopedic Exam: Muscle tone and strength are WNL. No limitations in general ROM. No crepitus or effusions noted.  Skin: No Porokeratosis. No infection or ulcers    Assessment & Plan:   1. Pain due to onychomycosis of toenails of both feet     Patient was evaluated and treated and all questions answered.  Onychomycosis with pain  -Nails palliatively debrided as below. -Educated on self-care  Procedure: Nail Debridement Rationale: pain  Type of Debridement: manual, sharp debridement. Instrumentation: Nail nipper, rotary burr. Number of Nails: 10  Procedures and Treatment: Consent by patient was obtained for treatment procedures. The patient understood the discussion of treatment and procedures well. All questions were answered thoroughly reviewed. Debridement of mycotic and hypertrophic toenails, 1 through 5 bilateral and clearing of subungual debris. No ulceration, no infection  noted.  Return Visit-Office Procedure: Patient instructed to return to the office for a follow up visit 3 months for continued evaluation and treatment.  Franky Blanch, DPM    No follow-ups on file.

## 2023-12-02 ENCOUNTER — Other Ambulatory Visit: Payer: Self-pay

## 2023-12-02 ENCOUNTER — Emergency Department

## 2023-12-02 ENCOUNTER — Encounter: Payer: Self-pay | Admitting: Emergency Medicine

## 2023-12-02 ENCOUNTER — Emergency Department
Admission: EM | Admit: 2023-12-02 | Discharge: 2023-12-02 | Disposition: A | Discharge status: Home / Self Care | Attending: Emergency Medicine | Admitting: Emergency Medicine

## 2023-12-02 DIAGNOSIS — E109 Type 1 diabetes mellitus without complications: Secondary | ICD-10-CM | POA: Insufficient documentation

## 2023-12-02 DIAGNOSIS — S61216A Laceration without foreign body of right little finger without damage to nail, initial encounter: Secondary | ICD-10-CM | POA: Diagnosis present

## 2023-12-02 DIAGNOSIS — W25XXXA Contact with sharp glass, initial encounter: Secondary | ICD-10-CM | POA: Insufficient documentation

## 2023-12-02 DIAGNOSIS — S99921A Unspecified injury of right foot, initial encounter: Secondary | ICD-10-CM

## 2023-12-02 MED ORDER — CEPHALEXIN 500 MG PO CAPS
500.0000 mg | ORAL_CAPSULE | Freq: Two times a day (BID) | ORAL | 0 refills | Status: AC
Start: 2023-12-02 — End: 2023-12-07

## 2023-12-02 NOTE — Discharge Instructions (Signed)
 You were evaluated in the ED for toe injury.  Your x-ray is normal and shows no foreign body.  Keep the area clean with soap and water .  Apply Neosporin or any antibiotic ointment.  Perform daily dressing changes.  Follow-up with your primary care in 1 week.

## 2023-12-02 NOTE — ED Triage Notes (Addendum)
 Pt arrived via POV with reports of glass jar breaking around R foot, reports glass in R pinky toe.   Pt has hx of diabetes.   Unknown when last tetanus was.

## 2023-12-03 NOTE — ED Provider Notes (Signed)
 [facility] Emergency Department Provider Note     Event Date/Time   First MD Initiated Contact with Patient 12/02/23 2114     (approximate)   History   Laceration   HPI  Jeanne Mosley is a 38 y.o. female with a history of diabetes type 1 presents to the ED for evaluation of possible glass in right pinky.  Patient reports dropped glass container while meal prepping and scattered on the floor.  She believes a piece of glass hit her toe.  Endorses some pain.  Patient is ambulatory.  No other complaints     Physical Exam   Triage Vital Signs: ED Triage Vitals  Encounter Vitals Group     BP 12/02/23 2105 (!) 131/93     Girls Systolic BP Percentile --      Girls Diastolic BP Percentile --      Boys Systolic BP Percentile --      Boys Diastolic BP Percentile --      Pulse Rate 12/02/23 2105 85     Resp 12/02/23 2105 17     Temp 12/02/23 2105 98.5 F (36.9 C)     Temp Source 12/02/23 2240 Oral     SpO2 12/02/23 2105 99 %     Weight 12/02/23 2106 172 lb (78 kg)     Height 12/02/23 2106 5' 2 (1.575 m)     Head Circumference --      Peak Flow --      Pain Score 12/02/23 2109 2     Pain Loc --      Pain Education --      Exclude from Growth Chart --     Most recent vital signs: Vitals:   12/02/23 2105 12/02/23 2240  BP: (!) 131/93 132/86  Pulse: 85 86  Resp: 17 16  Temp: 98.5 F (36.9 C) 98.4 F (36.9 C)  SpO2: 99% 99%    General Awake, no distress.  HEENT NCAT.  CV:  Good peripheral perfusion.  RESP:  Normal effort.  ABD:  No distention.  Other:  No deformity to right side pinky toe.  There does appear to be an abrasion to the lateral aspect of the nail.  No foreign body visualized or palpated.  Good capillary refill.   ED Results / Procedures / Treatments   Labs (all labs ordered are listed, but only abnormal results are displayed) Labs Reviewed - No data to display  RADIOLOGY  I personally viewed and evaluated these  images as part of my medical decision making, as well as reviewing the written report by the radiologist.  ED Provider Interpretation: Normal  DG Toe 5th Right Result Date: 12/02/2023 CLINICAL DATA:  Laceration, glass jar broke around foot. EXAM: RIGHT FIFTH TOE COMPARISON:  None Available. FINDINGS: There is no evidence of fracture or dislocation. There is no evidence of arthropathy or other focal bone abnormality. No radiopaque foreign body. No soft tissue gas. IMPRESSION: No fracture or radiopaque foreign body. Electronically Signed   By: Andrea Gasman M.D.   On: 12/02/2023 22:12    PROCEDURES:  Critical Care performed: No  Procedures   MEDICATIONS ORDERED IN ED: Medications - No data to display   IMPRESSION / MDM / ASSESSMENT AND PLAN / ED COURSE  I reviewed the triage vital signs and the nursing notes.  38 y.o. female presents to the emergency department for evaluation and treatment of right pinky toe injury. See HPI for further details.   Differential diagnosis includes, but is not limited to foreign body, laceration, fracture  Patient's presentation is most consistent with acute complicated illness / injury requiring diagnostic workup.  Patient is alert and oriented she is hemodynamically stable.  Physical exam findings as stated above.  X-rays are reassuring.  Xeroform gauze wrapped around pinky and dressing applied.  Given patient's diabetic status will send short course of antibiotics to pharmacy.  Encouraged follow-up with primary care in 1 to 2 weeks.  Patient verbalized understanding.  ED return precaution discussed.  FINAL CLINICAL IMPRESSION(S) / ED DIAGNOSES   Final diagnoses:  Toe injury, right, initial encounter   Rx / DC Orders   ED Discharge Orders          Ordered    cephALEXin (KEFLEX) 500 MG capsule  2 times daily        12/02/23 2232            Note:  This document was prepared using Dragon voice recognition  software and may include unintentional dictation errors.    Margrette, Hermione Havlicek A, PA-C 12/03/23 [place]    Arlander Charleston, MD 12/04/23 [phone]

## 2024-03-24 ENCOUNTER — Ambulatory Visit: Payer: Self-pay | Admitting: General Surgery

## 2024-04-15 NOTE — Progress Notes (Signed)
 Surgical Instructions   Your procedure is scheduled on Wednesday, November 19th, 2025. Report to [facility] Main Entrance A at 9:15 A.M., then check in with the Admitting office. Any questions or running late day of surgery: call [phone]  Questions prior to your surgery date: call [phone], Monday-Friday, 8am-4pm. If you experience any cold or flu symptoms such as cough, fever, chills, shortness of breath, etc. between now and your scheduled surgery, please notify us  at the above number.     Remember:  Do not eat after midnight the night before your surgery   You may drink clear liquids until 8:15 the morning of your surgery.   Clear liquids allowed are: Water , Non-Citrus Juices (without pulp), Carbonated Beverages, Clear Tea (no milk, honey, etc.), Black Coffee Only (NO MILK, CREAM OR POWDERED CREAMER of any kind), and Gatorade.    Take these medicines the morning of surgery with A SIP OF WATER : None   May take these medicines IF NEEDED: None    One week prior to surgery, STOP taking any Aspirin  (unless otherwise instructed by your surgeon) Aleve, Naproxen, Ibuprofen , Motrin , Advil , Goody's, BC's, all herbal medications, fish oil, and non-prescription vitamins.   WHAT DO I DO ABOUT MY DIABETES MEDICATION?   Please contact your PCP or endocrinologist for Insulin  pump instructions.  If no instructions received, reduce your basal rate by 20% at midnight the night prior to surgery and keep your basal rate reduced at this rate on the morning of surgery.     HOW TO MANAGE YOUR DIABETES BEFORE AND AFTER SURGERY  Why is it important to control my blood sugar before and after surgery? Improving blood sugar levels before and after surgery helps healing and can limit problems. A way of improving blood sugar control is eating a healthy diet by:  Eating less sugar and carbohydrates  Increasing activity/exercise  Talking with your doctor about reaching your blood sugar  goals High blood sugars (greater than 180 mg/dL) can raise your risk of infections and slow your recovery, so you will need to focus on controlling your diabetes during the weeks before surgery. Make sure that the doctor who takes care of your diabetes knows about your planned surgery including the date and location.  How do I manage my blood sugar before surgery? Check your blood sugar at least 4 times a day, starting 2 days before surgery, to make sure that the level is not too high or low.  Check your blood sugar the morning of your surgery when you wake up and every 2 hours until you get to the Short Stay unit.  If your blood sugar is less than 70 mg/dL, you will need to treat for low blood sugar: Do not take insulin . Treat a low blood sugar (less than 70 mg/dL) with  cup of clear juice (cranberry or apple), 4 glucose tablets, OR glucose gel. Recheck blood sugar in 15 minutes after treatment (to make sure it is greater than 70 mg/dL). If your blood sugar is not greater than 70 mg/dL on recheck, call [phone] for further instructions. Report your blood sugar to the short stay nurse when you get to Short Stay.  If you are admitted to the hospital after surgery: Your blood sugar will be checked by the staff and you will probably be given insulin  after surgery (instead of oral diabetes medicines) to make sure you have good blood sugar levels. The goal for blood sugar control after surgery is 80-180 mg/dL.  Do NOT Smoke (Tobacco/Vaping) for 24 hours prior to your procedure.  If you use a CPAP at night, you may bring your mask/headgear for your overnight stay.   You will be asked to remove any contacts, glasses, piercing's, hearing aid's, dentures/partials prior to surgery. Please bring cases for these items if needed.    Patients discharged the day of surgery will not be allowed to drive home, and someone needs to stay with them for 24 hours.  SURGICAL WAITING  ROOM VISITATION Patients may have no more than 2 support people in the waiting area - these visitors may rotate.   Pre-op nurse will coordinate an appropriate time for 1 ADULT support person, who may not rotate, to accompany patient in pre-op.  Children under the age of 85 must have an adult with them who is not the patient and must remain in the main waiting area with an adult.  If the patient needs to stay at the hospital during part of their recovery, the visitor guidelines for inpatient rooms apply.  Please refer to the [facility] website for the visitor guidelines for any additional information.   If you received a COVID test during your pre-op visit  it is requested that you wear a mask when out in public, stay away from anyone that may not be feeling well and notify your surgeon if you develop symptoms. If you have been in contact with anyone that has tested positive in the last 10 days please notify you surgeon.      Pre-operative CHG Bathing Instructions   You can play a key role in reducing the risk of infection after surgery. Your skin needs to be as free of germs as possible. You can reduce the number of germs on your skin by washing with CHG (chlorhexidine gluconate) soap before surgery. CHG is an antiseptic soap that kills germs and continues to kill germs even after washing.   DO NOT use if you have an allergy to chlorhexidine/CHG or antibacterial soaps. If your skin becomes reddened or irritated, stop using the CHG and notify one of our RNs at [phone].              TAKE A SHOWER THE NIGHT BEFORE SURGERY   Please keep in mind the following:  DO NOT shave, including legs and underarms, 48 hours prior to surgery.   You may shave your face before/day of surgery.  Place clean sheets on your bed the night before surgery Use a clean washcloth (not used since being washed) for shower. DO NOT sleep with pet's night before surgery.  CHG Shower Instructions:  Wash your face  and private area with normal soap. If you choose to wash your hair, wash first with your normal shampoo.  After you use shampoo/soap, rinse your hair and body thoroughly to remove shampoo/soap residue.  Turn the water  OFF and apply half the bottle of CHG soap to a CLEAN washcloth.  Apply CHG soap ONLY FROM YOUR NECK DOWN TO YOUR TOES (washing for 3-5 minutes)  DO NOT use CHG soap on face, private areas, open wounds, or sores.  Pay special attention to the area where your surgery is being performed.  If you are having back surgery, having someone wash your back for you may be helpful. Wait 2 minutes after CHG soap is applied, then you may rinse off the CHG soap.  Pat dry with a clean towel  Put on clean pajamas    Additional instructions for the day  of surgery: If you choose, you may shower the morning of surgery with an antibacterial soap.  DO NOT APPLY any lotions, deodorants, cologne, or perfumes.   Do not wear jewelry or makeup Do not wear nail polish, gel polish, artificial nails, or any other type of covering on natural nails (fingers and toes) Do not bring valuables to the hospital. [facility] is not responsible for valuables/personal belongings. Put on clean/comfortable clothes.  Please brush your teeth.  Ask your nurse before applying any prescription medications to the skin.

## 2024-04-16 ENCOUNTER — Inpatient Hospital Stay (HOSPITAL_COMMUNITY)
Admission: RE | Admit: 2024-04-16 | Discharge: 2024-04-16 | Disposition: A | Discharge status: Home / Self Care | Source: Ambulatory Visit

## 2024-04-16 NOTE — Progress Notes (Signed)
 Missed PAT appointment. Spoke to patient on the phone, her car is no working and want to re-schedule surgery. Instructed patient to notify Dr. Normie office.

## 2024-04-23 ENCOUNTER — Ambulatory Visit (HOSPITAL_COMMUNITY): Admission: RE | Admit: 2024-04-23 | Source: Home / Self Care | Admitting: General Surgery

## 2024-04-23 ENCOUNTER — Encounter (HOSPITAL_COMMUNITY): Admission: RE | Payer: Self-pay | Source: Home / Self Care

## 2024-04-23 SURGERY — EXCISION, MASS, CHEST WALL
Anesthesia: General

## 2024-06-06 ENCOUNTER — Ambulatory Visit: Payer: Self-pay | Admitting: General Surgery

## 2024-06-06 NOTE — Pre-Procedure Instructions (Signed)
 Surgical Instructions   Your procedure is scheduled on June 11, 2024. Report to [facility] Main Entrance A at 1:00 P.M., then check in with the Admitting office. Any questions or running late day of surgery: call [phone]  Questions prior to your surgery date: call [phone], Monday-Friday, 8am-4pm. If you experience any cold or flu symptoms such as cough, fever, chills, shortness of breath, etc. between now and your scheduled surgery, please notify us  at the above number.     Remember:  Do not eat after midnight the night before your surgery   You may drink clear liquids until 12:00 PM the afternoon of your surgery.   Clear liquids allowed are: Water , Non-Citrus Juices (without pulp), Carbonated Beverages, Clear Tea (no milk, honey, etc.), Black Coffee Only (NO MILK, CREAM OR POWDERED CREAMER of any kind), and Gatorade.    Take these medicines the morning of surgery with A SIP OF WATER : NONE   One week prior to surgery, STOP taking any Aspirin  (unless otherwise instructed by your surgeon) Aleve, Naproxen, Ibuprofen , Motrin , Advil , Goody's, BC's, all herbal medications, fish oil, and non-prescription vitamins.   WHAT DO I DO ABOUT MY DIABETES MEDICATION?   Insulin  Pump: Reduce all basal rates by 20% at midnight prior to surgery OR contact Endocrinologist for instructions.   HOW TO MANAGE YOUR DIABETES BEFORE AND AFTER SURGERY  Why is it important to control my blood sugar before and after surgery? Improving blood sugar levels before and after surgery helps healing and can limit problems. A way of improving blood sugar control is eating a healthy diet by:  Eating less sugar and carbohydrates  Increasing activity/exercise  Talking with your doctor about reaching your blood sugar goals High blood sugars (greater than 180 mg/dL) can raise your risk of infections and slow your recovery, so you will need to focus on controlling your diabetes during the weeks before  surgery. Make sure that the doctor who takes care of your diabetes knows about your planned surgery including the date and location.  How do I manage my blood sugar before surgery? Check your blood sugar at least 4 times a day, starting 2 days before surgery, to make sure that the level is not too high or low.  Check your blood sugar the morning of your surgery when you wake up and every 2 hours until you get to the Short Stay unit.  If your blood sugar is less than 70 mg/dL, you will need to treat for low blood sugar: Do not take insulin . Treat a low blood sugar (less than 70 mg/dL) with  cup of clear juice (cranberry or apple), 4 glucose tablets, OR glucose gel. Recheck blood sugar in 15 minutes after treatment (to make sure it is greater than 70 mg/dL). If your blood sugar is not greater than 70 mg/dL on recheck, call [phone] for further instructions. Report your blood sugar to the short stay nurse when you get to Short Stay.  If you are admitted to the hospital after surgery: Your blood sugar will be checked by the staff and you will probably be given insulin  after surgery (instead of oral diabetes medicines) to make sure you have good blood sugar levels. The goal for blood sugar control after surgery is 80-180 mg/dL.                      Do NOT Smoke (Tobacco/Vaping) for 24 hours prior to your procedure.  If you use a CPAP at night,  you may bring your mask/headgear for your overnight stay.   You will be asked to remove any contacts, glasses, piercing's, hearing aid's, dentures/partials prior to surgery. Please bring cases for these items if needed.    Patients discharged the day of surgery will not be allowed to drive home, and someone needs to stay with them for 24 hours.  SURGICAL WAITING ROOM VISITATION Patients may have no more than 2 support people in the waiting area - these visitors may rotate.   Pre-op nurse will coordinate an appropriate time for 1 ADULT support  person, who may not rotate, to accompany patient in pre-op.  Children under the age of 15 must have an adult with them who is not the patient and must remain in the main waiting area with an adult.  If the patient needs to stay at the hospital during part of their recovery, the visitor guidelines for inpatient rooms apply.  Please refer to the [facility] website for the visitor guidelines for any additional information.   If you received a COVID test during your pre-op visit  it is requested that you wear a mask when out in public, stay away from anyone that may not be feeling well and notify your surgeon if you develop symptoms. If you have been in contact with anyone that has tested positive in the last 10 days please notify you surgeon.      Pre-operative CHG Bathing Instructions   You can play a key role in reducing the risk of infection after surgery. Your skin needs to be as free of germs as possible. You can reduce the number of germs on your skin by washing with CHG (chlorhexidine gluconate) soap before surgery. CHG is an antiseptic soap that kills germs and continues to kill germs even after washing.   DO NOT use if you have an allergy to chlorhexidine/CHG or antibacterial soaps. If your skin becomes reddened or irritated, stop using the CHG and notify one of our RNs at [phone].              TAKE A SHOWER THE NIGHT BEFORE SURGERY   Please keep in mind the following:  DO NOT shave, including legs and underarms, 48 hours prior to surgery.   You may shave your face before/day of surgery.  Place clean sheets on your bed the night before surgery Use a clean washcloth (not used since being washed) for shower. DO NOT sleep with pet's night before surgery.  CHG Shower Instructions:  Wash your face and private area with normal soap. If you choose to wash your hair, wash first with your normal shampoo.  After you use shampoo/soap, rinse your hair and body thoroughly to remove  shampoo/soap residue.  Turn the water  OFF and apply half the bottle of CHG soap to a CLEAN washcloth.  Apply CHG soap ONLY FROM YOUR NECK DOWN TO YOUR TOES (washing for 3-5 minutes)  DO NOT use CHG soap on face, private areas, open wounds, or sores.  Pay special attention to the area where your surgery is being performed.  If you are having back surgery, having someone wash your back for you may be helpful. Wait 2 minutes after CHG soap is applied, then you may rinse off the CHG soap.  Pat dry with a clean towel  Put on clean pajamas    Additional instructions for the day of surgery: If you choose, you may shower the morning of surgery with an antibacterial soap.  DO NOT  APPLY any lotions, deodorants, cologne, or perfumes.   Do not wear jewelry or makeup Do not wear nail polish, gel polish, artificial nails, or any other type of covering on natural nails (fingers and toes) Do not bring valuables to the hospital. [facility] is not responsible for valuables/personal belongings. Put on clean/comfortable clothes.  Please brush your teeth.  Ask your nurse before applying any prescription medications to the skin.

## 2024-06-09 ENCOUNTER — Encounter (HOSPITAL_COMMUNITY): Payer: Self-pay | Admitting: Physician Assistant

## 2024-06-09 ENCOUNTER — Encounter (HOSPITAL_COMMUNITY)
Admission: RE | Admit: 2024-06-09 | Discharge: 2024-06-09 | Disposition: A | Discharge status: Home / Self Care | Source: Ambulatory Visit | Attending: General Surgery | Admitting: General Surgery

## 2024-06-09 ENCOUNTER — Other Ambulatory Visit: Payer: Self-pay

## 2024-06-09 ENCOUNTER — Encounter (HOSPITAL_COMMUNITY): Payer: Self-pay

## 2024-06-09 VITALS — BP 138/92 | HR 80 | Temp 98.4°F | Resp 18 | Ht 62.0 in | Wt 166.3 lb

## 2024-06-09 DIAGNOSIS — Z01818 Encounter for other preprocedural examination: Secondary | ICD-10-CM | POA: Insufficient documentation

## 2024-06-09 DIAGNOSIS — E109 Type 1 diabetes mellitus without complications: Secondary | ICD-10-CM | POA: Diagnosis not present

## 2024-06-09 HISTORY — DX: Myoneural disorder, unspecified: G70.9

## 2024-06-09 HISTORY — DX: Other complications of anesthesia, initial encounter: T88.59XA

## 2024-06-09 LAB — CBC
HCT: 40 % (ref 36.0–46.0)
Hemoglobin: 12.9 g/dL (ref 12.0–15.0)
MCH: 28.4 pg (ref 26.0–34.0)
MCHC: 32.3 g/dL (ref 30.0–36.0)
MCV: 87.9 fL (ref 80.0–100.0)
Platelets: 392 K/uL (ref 150–400)
RBC: 4.55 MIL/uL (ref 3.87–5.11)
RDW: 14 % (ref 11.5–15.5)
WBC: 6.9 K/uL (ref 4.0–10.5)
nRBC: 0 % (ref 0.0–0.2)

## 2024-06-09 LAB — BASIC METABOLIC PANEL WITH GFR
Anion gap: 8 (ref 5–15)
BUN: <5 mg/dL — ABNORMAL LOW (ref 6–20)
CO2: 30 mmol/L (ref 22–32)
Calcium: 9.3 mg/dL (ref 8.9–10.3)
Chloride: 98 mmol/L (ref 98–111)
Creatinine, Ser: 0.66 mg/dL (ref 0.44–1.00)
GFR, Estimated: >60 mL/min
Glucose, Bld: 155 mg/dL — ABNORMAL HIGH (ref 70–99)
Potassium: 4 mmol/L (ref 3.5–5.1)
Sodium: 136 mmol/L (ref 135–145)

## 2024-06-09 LAB — GLUCOSE, CAPILLARY: Glucose-Capillary: 192 mg/dL — ABNORMAL HIGH (ref 70–99)

## 2024-06-09 NOTE — Progress Notes (Signed)
 Due to patients recent URI as described in pre-op note, pts schedule will need to be postponed at least one more week. Discussed with Lynwood Hope, PA-C who notified the surgeon's office and they will get her rescheduled.

## 2024-06-09 NOTE — Progress Notes (Addendum)
 PCP - Bernita Ned, PA Cardiologist - Denies Endocrinologist - Delon Nam - last office visit 05/20/2024 Neurologist - Fairy Pinal - Last office visit 04/28/2020 - pt states she has had no issues with her MS Rheumatologist - Dr. Wallie Ego - last office visit 02/16/2022 - pt states she has had no issues with her Lupus  PPM/ICD - Denies Device Orders - n/a Rep Notified - n/a  Chest x-ray - n/a EKG - 06/09/2024 Stress Test - Denies ECHO - Denies Cardiac Cath - Denies  Sleep Study - Denies CPAP - n/a  Pt is DM1. She has an insulin  pump and checks her blood sugar multiple times per day. Normal fasting is 110s-130s. CBG at pre-op appointment 192. Last A1c 7.9 on 05/20/2024  Last dose of GLP1 agonist- n/a GLP1 instructions: n/a  Blood Thinner Instructions: n/a Aspirin  Instructions: n/a  ERAS Protcol - Clear liquids until 1200 morning of surgery PRE-SURGERY Ensure or G2- n/a  COVID TEST- n/a   Anesthesia review: Yes. Recent cold as described below. Hx of DM1, MS and Lupus. BP elevated at pre-op appointment (131/96), but pt was also running late. Rechecked at conclusion and result 138/92.  Patient denies shortness of breath, fever, cough and chest pain at PAT appointment. Pt endorses a cold that started the last weekend of December. Symptoms included runny nose, occasional SOB and non-productive cough. Denies fever or sore throat. She has had symptom resolution since January 3rd. Discussed with Lynwood Hope, PA-C   All instructions explained to the patient, with a verbal understanding of the material. Patient agrees to go over the instructions while at home for a better understanding. Patient also instructed to self quarantine after being tested for COVID-19. The opportunity to ask questions was provided.

## 2024-06-10 NOTE — Progress Notes (Signed)
 Spoke with the pt, she called Dr Normie office today. She was told to just come to short stay tom as scheduled and she will be assessed and will decide if Dr will proceed with surgery. Pt is feeling better today. She will arrive tom at 1300 and she will stop clear liquids at 1200.

## 2024-06-11 ENCOUNTER — Encounter (HOSPITAL_COMMUNITY)
Admission: RE | Disposition: A | Discharge status: Home / Self Care | Payer: Self-pay | Source: Home / Self Care | Attending: General Surgery

## 2024-06-11 ENCOUNTER — Ambulatory Visit (HOSPITAL_COMMUNITY)
Admission: RE | Admit: 2024-06-11 | Discharge: 2024-06-11 | Disposition: A | Discharge status: Home / Self Care | Attending: General Surgery | Admitting: General Surgery

## 2024-06-11 SURGERY — EXCISION, MASS, CHEST WALL
Anesthesia: General

## 2024-06-11 NOTE — H&P (Signed)
 Patient informed me in pre-op that the area concerning for a cyst on her chest and the one in her left axilla had resolved. On exam, I could appreciate a small mass within the left axilla consistent with a cyst. I could not palpate anything on her chest. I offered her excision of the left axillary cyst, excision of the left axillary cyst and exploration for the chest lesion, or observation. She wanted to avoid surgery if possible. I therefore canceled her surgery. She will reach out to the office if she would like to reconsider surgery and/or wants me to order an ultrasound to evaluate the chest lesion.  Jeanne Idler, MD   General Surgeon [facility], [place]

## 2024-06-11 NOTE — Progress Notes (Signed)
 Dr. Polly spoke with patient and cancelled surgery due to patient not wanted to proceed with surgery.

## 2024-06-24 ENCOUNTER — Other Ambulatory Visit: Payer: Self-pay

## 2024-06-24 ENCOUNTER — Emergency Department
Admission: EM | Admit: 2024-06-24 | Discharge: 2024-06-24 | Disposition: A | Discharge status: Home / Self Care | Attending: Emergency Medicine | Admitting: Emergency Medicine

## 2024-06-24 DIAGNOSIS — E119 Type 2 diabetes mellitus without complications: Secondary | ICD-10-CM | POA: Diagnosis not present

## 2024-06-24 DIAGNOSIS — L720 Epidermal cyst: Secondary | ICD-10-CM | POA: Diagnosis present

## 2024-06-24 MED ORDER — CEPHALEXIN 500 MG PO CAPS: 500.0000 mg | ORAL_CAPSULE | Freq: Four times a day (QID) | ORAL | 0 refills | Status: AC

## 2024-06-24 NOTE — Discharge Instructions (Signed)
 Please follow-up with your surgeon for removal of the cyst.  Return to the emergency department if you have worsening symptoms like increasing pain, swelling, redness or purulent drainage.  I recommend doing warm compresses over the area which may bring this up to the surface and cause it to drain on its own.  Sent antibiotics for you to take.  Take this medication as prescribed.  Sure you take all the medication even if you begin to feel better.

## 2024-06-24 NOTE — ED Triage Notes (Signed)
 Pt sts that she has a cyst on her chest that is in between her breasts. Pt sts that she is suppose to have surgery on it but it went away and now it has come back bigger then before. Cysts is approx the size of a pea.

## 2024-06-24 NOTE — ED Provider Notes (Signed)
 "  [facility] Provider Note    Event Date/Time   First MD Initiated Contact with Patient 06/24/24 1405     (approximate)   History   Abscess   HPI  Jeanne Mosley is a 39 y.o. female with PMH of MS, diabetes presents for evaluation of a cyst between her breasts.  Patient states she was scheduled for surgery to have this removed but it went away on its own.  Patient returns as it has come back.  She reached out to the surgeons office to schedule surgery again but mention to them that it was making her feel short of breath so she was directed to come to the ED.  She states that she is not short of breath per se but has a tightness in her chest where the cyst is located.      Physical Exam   Triage Vital Signs: ED Triage Vitals  Encounter Vitals Group     BP 06/24/24 1335 (!) 144/92     Girls Systolic BP Percentile --      Girls Diastolic BP Percentile --      Boys Systolic BP Percentile --      Boys Diastolic BP Percentile --      Pulse Rate 06/24/24 1335 80     Resp 06/24/24 1335 17     Temp 06/24/24 1335 97.8 F (36.6 C)     Temp Source 06/24/24 1335 Oral     SpO2 06/24/24 1335 100 %     Weight 06/24/24 1336 167 lb (75.8 kg)     Height 06/24/24 1336 5' 2 (1.575 m)     Head Circumference --      Peak Flow --      Pain Score 06/24/24 1336 2     Pain Loc --      Pain Education --      Exclude from Growth Chart --     Most recent vital signs: Vitals:   06/24/24 1335  BP: (!) 144/92  Pulse: 80  Resp: 17  Temp: 97.8 F (36.6 C)  SpO2: 100%   General: Awake, no distress.  CV:  Good peripheral perfusion.  Resp:  Normal effort.  Abd:  No distention.  Other:  Palpable nodule in between the breasts that is under to palpation.   ED Results / Procedures / Treatments   Labs (all labs ordered are listed, but only abnormal results are displayed) Labs Reviewed - No data to display   PROCEDURES:  Critical Care performed:  No  Procedures   MEDICATIONS ORDERED IN ED: Medications - No data to display   IMPRESSION / MDM / ASSESSMENT AND PLAN / ED COURSE  I reviewed the triage vital signs and the nursing notes.                             39 year old female presents for evaluation of a cyst.  Blood pressure was a little bit elevated but vital signs are stable otherwise.  Patient NAD on exam.  Differential diagnosis includes, but is not limited to, cyst, abscess, folliculitis.  Patient's presentation is most consistent with acute, uncomplicated illness.  Area of concern was evaluated using bedside ultrasound.  Did not note a distinct fluid collection that would benefit from incision and drainage.  Patient does appear to have cysts that are greater than a centimeter deep in the skin.  On exam did not feel any areas of  fluctuance but rather there are hard palpable nodules that are tender to palpation.  There was not overlying erythema or warmth but patient reports this occurring over the past couple days.  I explained to the patient that she will need to follow-up with the surgeon to have the cyst removed.  We discussed antibiotics versus watchful waiting and patient would prefer to have antibiotic coverage.  She voiced understanding, all questions were answered and she was stable at discharge.      FINAL CLINICAL IMPRESSION(S) / ED DIAGNOSES   Final diagnoses:  Epidermoid cyst of skin of chest     Rx / DC Orders   ED Discharge Orders          Ordered    cephALEXin  (KEFLEX ) 500 MG capsule  4 times daily        06/24/24 1520             Note:  This document was prepared using Dragon voice recognition software and may include unintentional dictation errors.   Cleaster Tinnie LABOR, PA-C 06/24/24 1520  "
